# Patient Record
Sex: Female | Born: 2000 | Hispanic: Yes | Marital: Single | Smoking: Never smoker
Health system: Southern US, Community
[De-identification: ages and names within clinical notes are randomized; demographics above are authoritative.]

## PROBLEM LIST (undated history)

## (undated) DIAGNOSIS — F419 Anxiety disorder, unspecified: Secondary | ICD-10-CM

## (undated) DIAGNOSIS — F988 Other specified behavioral and emotional disorders with onset usually occurring in childhood and adolescence: Secondary | ICD-10-CM

## (undated) HISTORY — PX: NO PAST SURGERIES: SHX2092

## (undated) HISTORY — DX: Anxiety disorder, unspecified: F41.9

---
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
## 2022-01-12 ENCOUNTER — Encounter: Payer: Self-pay | Admitting: Physician Assistant

## 2022-04-14 ENCOUNTER — Encounter: Payer: Self-pay | Admitting: Physician Assistant

## 2022-05-08 IMAGING — DX DG ANKLE COMPLETE 3+V*L*
3 series · 3 of 3 positions shown · non-contrast
Comparison: None.

CLINICAL DATA: Injury of left ankle, initial encounter. Left ankle
walled and pain since [REDACTED]. Technologist notes state fell down
flight of stairs.

EXAM:
LEFT ANKLE COMPLETE - 3+ VIEW

[ankle ap]
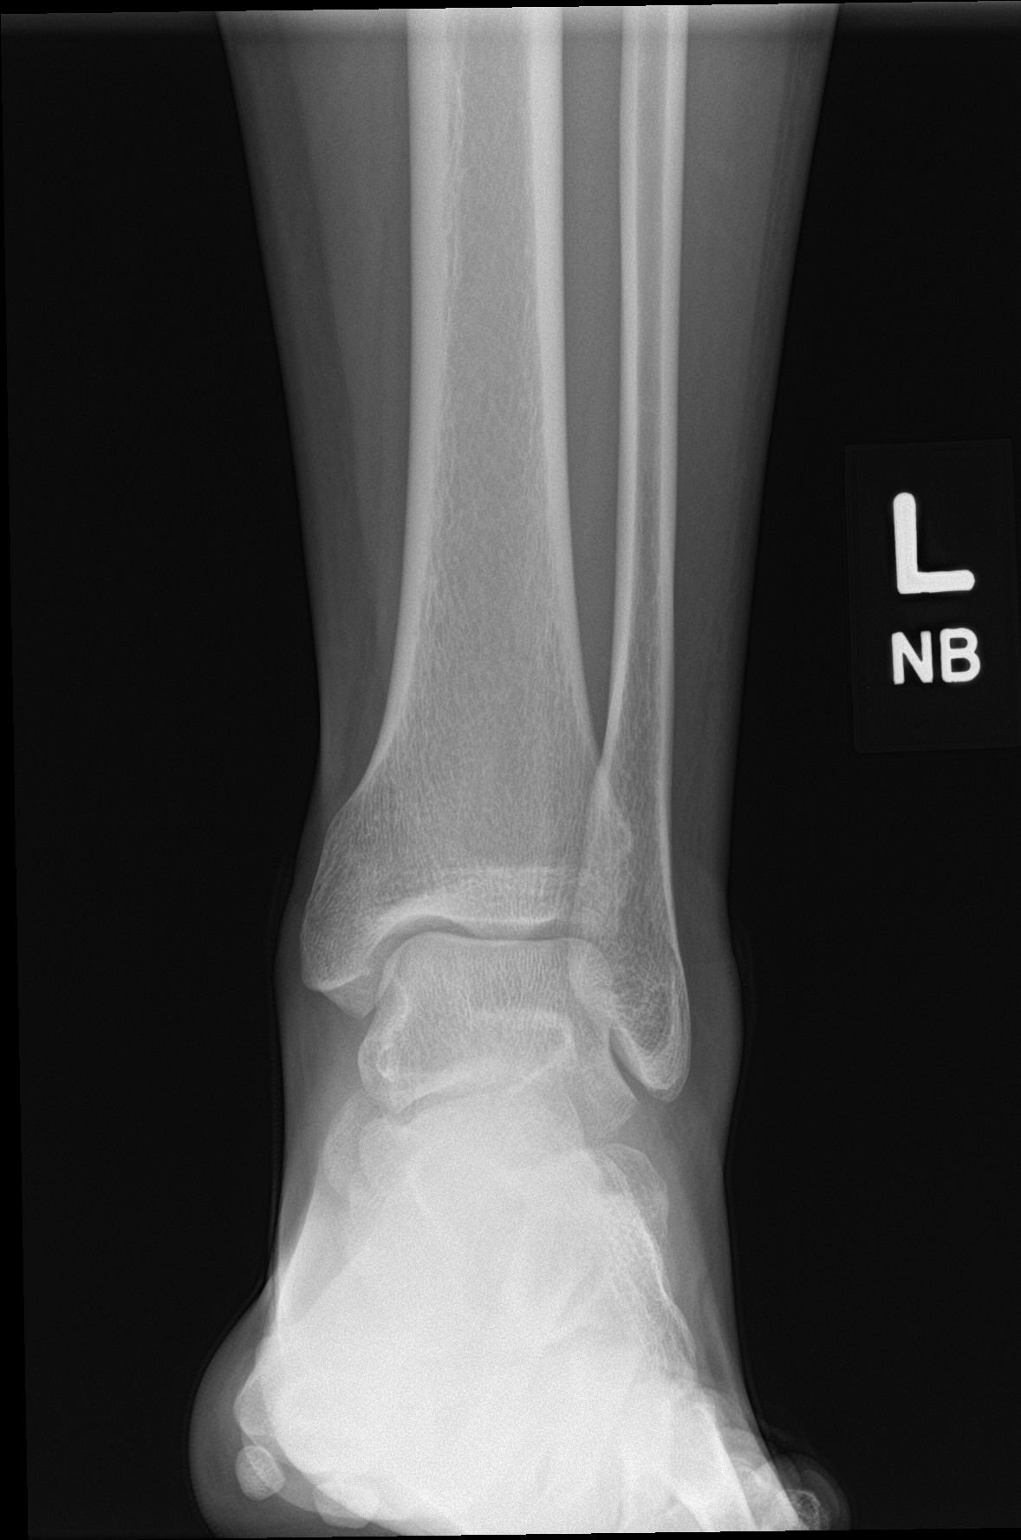

[ankle obl]
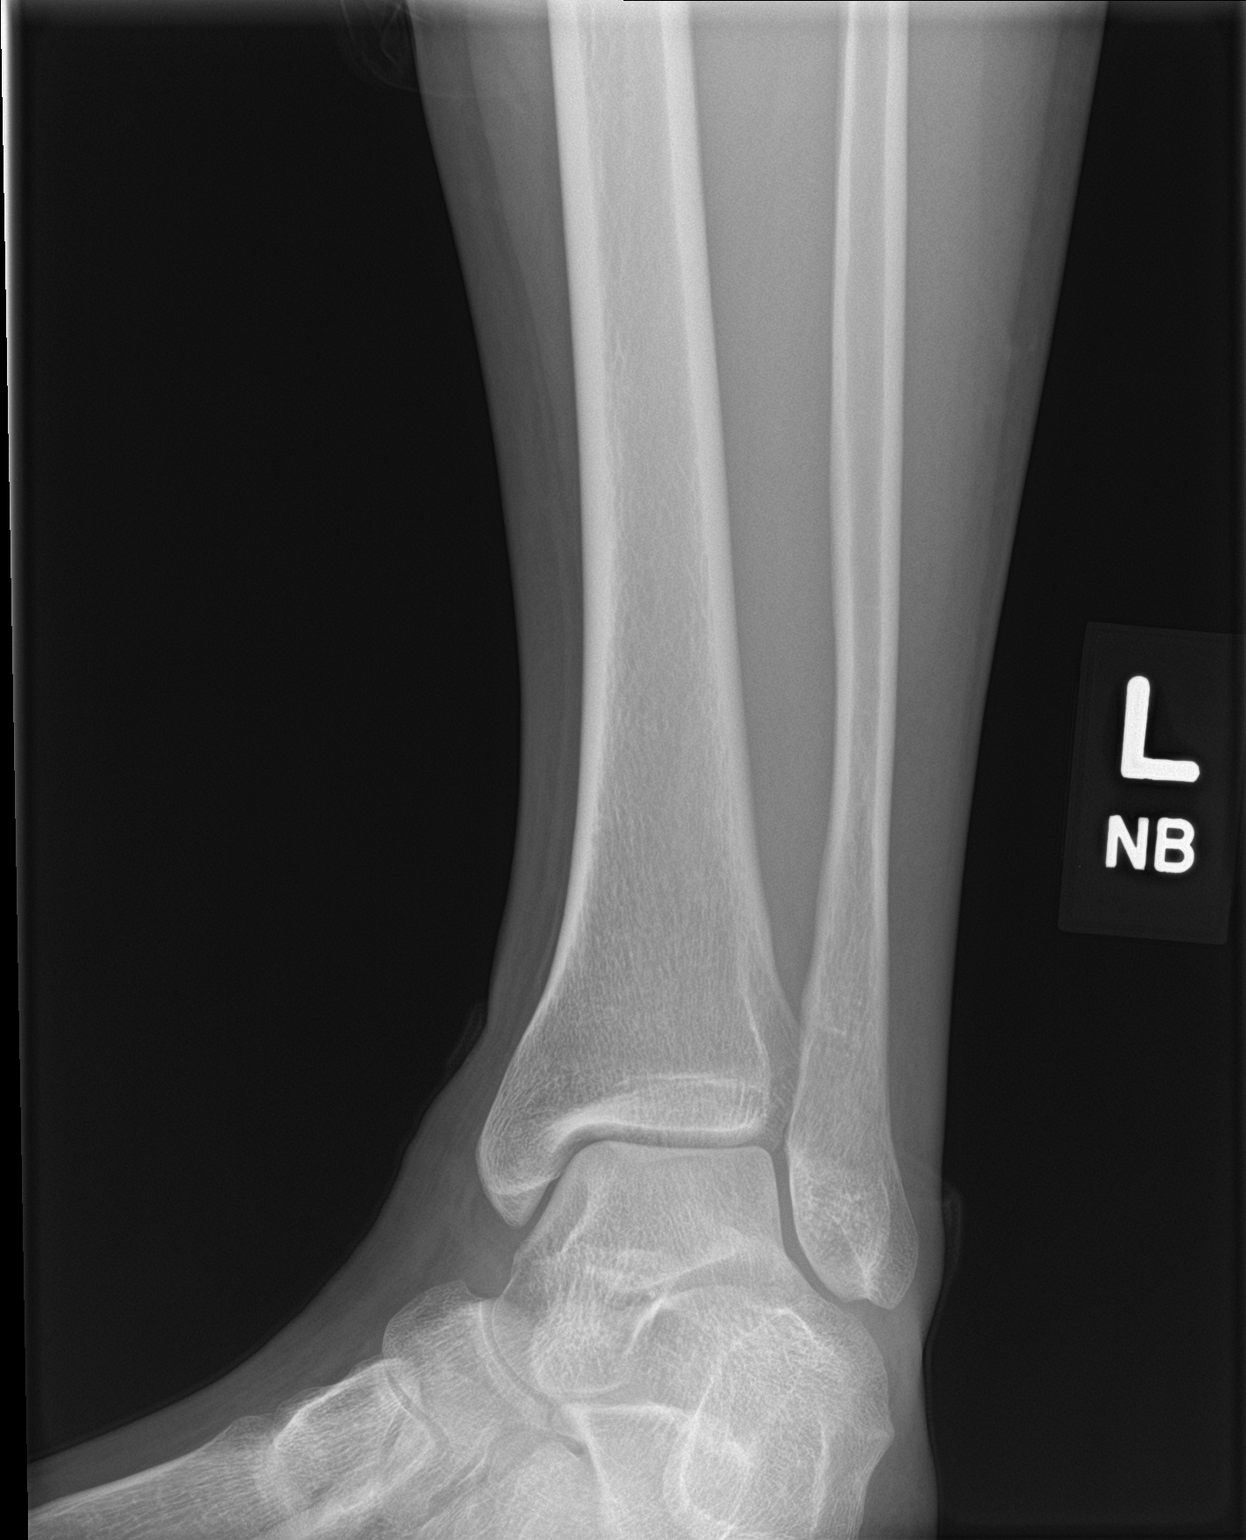

[ankle lat]
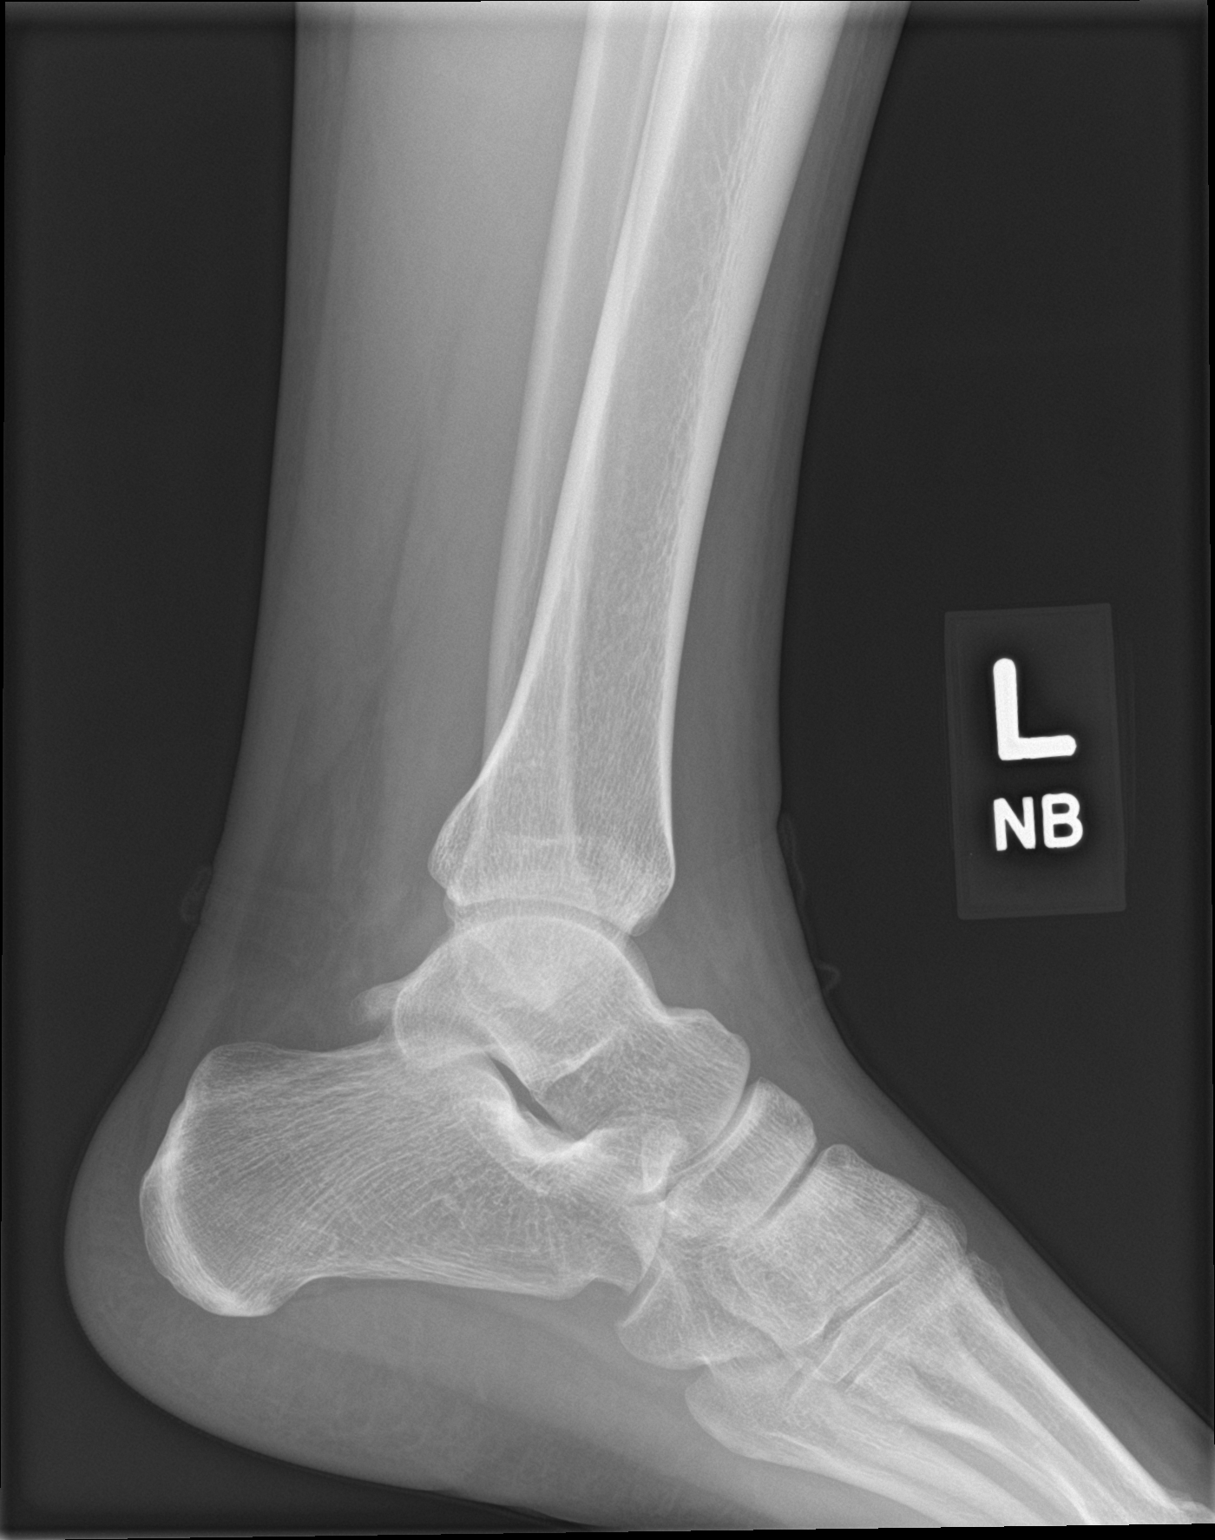

[3 of 3 positions shown; findings below may reference images not displayed]

FINDINGS: No fracture or dislocation. The ankle mortise is preserved. The base
of the fifth metatarsal is intact. Intact talar dome. Small ankle
joint effusion. There is soft tissue edema most prominent laterally.
IMPRESSION: Soft tissue edema and small joint effusion. No fracture or
dislocation.

## 2022-06-21 ENCOUNTER — Encounter (HOSPITAL_BASED_OUTPATIENT_CLINIC_OR_DEPARTMENT_OTHER): Payer: Self-pay | Admitting: Pediatrics

## 2022-06-21 ENCOUNTER — Other Ambulatory Visit: Payer: Self-pay

## 2022-06-21 ENCOUNTER — Emergency Department (HOSPITAL_BASED_OUTPATIENT_CLINIC_OR_DEPARTMENT_OTHER): Payer: Medicaid Other

## 2022-06-21 ENCOUNTER — Emergency Department (HOSPITAL_BASED_OUTPATIENT_CLINIC_OR_DEPARTMENT_OTHER)
Admission: EM | Admit: 2022-06-21 | Discharge: 2022-06-22 | Disposition: A | Discharge status: Home / Self Care | Payer: Medicaid Other | Attending: Emergency Medicine | Admitting: Emergency Medicine

## 2022-06-21 DIAGNOSIS — B9689 Other specified bacterial agents as the cause of diseases classified elsewhere: Secondary | ICD-10-CM | POA: Diagnosis not present

## 2022-06-21 DIAGNOSIS — N83291 Other ovarian cyst, right side: Secondary | ICD-10-CM | POA: Diagnosis not present

## 2022-06-21 DIAGNOSIS — N83292 Other ovarian cyst, left side: Secondary | ICD-10-CM | POA: Diagnosis not present

## 2022-06-21 DIAGNOSIS — R1031 Right lower quadrant pain: Secondary | ICD-10-CM | POA: Diagnosis present

## 2022-06-21 DIAGNOSIS — N760 Acute vaginitis: Secondary | ICD-10-CM | POA: Diagnosis not present

## 2022-06-21 DIAGNOSIS — N83201 Unspecified ovarian cyst, right side: Secondary | ICD-10-CM

## 2022-06-21 DIAGNOSIS — R102 Pelvic and perineal pain: Secondary | ICD-10-CM

## 2022-06-21 DIAGNOSIS — R1030 Lower abdominal pain, unspecified: Secondary | ICD-10-CM

## 2022-06-21 LAB — URINALYSIS, ROUTINE W REFLEX MICROSCOPIC
Bilirubin Urine: NEGATIVE
Glucose, UA: NEGATIVE mg/dL
Hgb urine dipstick: NEGATIVE
Ketones, ur: NEGATIVE mg/dL
Leukocytes,Ua: NEGATIVE
Nitrite: NEGATIVE
Protein, ur: NEGATIVE mg/dL
Specific Gravity, Urine: 1.02 (ref 1.005–1.030)
pH: 7.5 (ref 5.0–8.0)

## 2022-06-21 LAB — CBC WITH DIFFERENTIAL/PLATELET
Abs Immature Granulocytes: 0.03 10*3/uL (ref 0.00–0.07)
Basophils Absolute: 0 10*3/uL (ref 0.0–0.1)
Basophils Relative: 0 %
Eosinophils Absolute: 0.3 10*3/uL (ref 0.0–0.5)
Eosinophils Relative: 3 %
HCT: 38.4 % (ref 36.0–46.0)
Hemoglobin: 13.1 g/dL (ref 12.0–15.0)
Immature Granulocytes: 0 %
Lymphocytes Relative: 35 %
Lymphs Abs: 3.1 10*3/uL (ref 0.7–4.0)
MCH: 30.5 pg (ref 26.0–34.0)
MCHC: 34.1 g/dL (ref 30.0–36.0)
MCV: 89.3 fL (ref 80.0–100.0)
Monocytes Absolute: 0.6 10*3/uL (ref 0.1–1.0)
Monocytes Relative: 6 %
Neutro Abs: 4.9 10*3/uL (ref 1.7–7.7)
Neutrophils Relative %: 56 %
Platelets: 315 10*3/uL (ref 150–400)
RBC: 4.3 MIL/uL (ref 3.87–5.11)
RDW: 12.8 % (ref 11.5–15.5)
WBC: 8.9 10*3/uL (ref 4.0–10.5)
nRBC: 0 % (ref 0.0–0.2)

## 2022-06-21 LAB — WET PREP, GENITAL
Sperm: NONE SEEN
Trich, Wet Prep: NONE SEEN
WBC, Wet Prep HPF POC: >=10 — AB (ref <10)
Yeast Wet Prep HPF POC: NONE SEEN

## 2022-06-21 LAB — COMPREHENSIVE METABOLIC PANEL
ALT: 12 U/L (ref 0–44)
AST: 18 U/L (ref 15–41)
Albumin: 4 g/dL (ref 3.5–5.0)
Alkaline Phosphatase: 47 U/L (ref 38–126)
Anion gap: 7 (ref 5–15)
BUN: 12 mg/dL (ref 6–20)
CO2: 27 mmol/L (ref 22–32)
Calcium: 8.8 mg/dL — ABNORMAL LOW (ref 8.9–10.3)
Chloride: 104 mmol/L (ref 98–111)
Creatinine, Ser: 0.67 mg/dL (ref 0.44–1.00)
GFR, Estimated: >60 mL/min (ref >60)
Glucose, Bld: 101 mg/dL — ABNORMAL HIGH (ref 70–99)
Potassium: 3.4 mmol/L — ABNORMAL LOW (ref 3.5–5.1)
Sodium: 138 mmol/L (ref 135–145)
Total Bilirubin: 0.6 mg/dL (ref 0.3–1.2)
Total Protein: 6.9 g/dL (ref 6.5–8.1)

## 2022-06-21 LAB — PREGNANCY, URINE: Preg Test, Ur: NEGATIVE

## 2022-06-21 MED ORDER — MORPHINE SULFATE (PF) 2 MG/ML IV SOLN
2.0000 mg | Freq: Once | INTRAVENOUS | Status: AC
  Administered 2022-06-21: 2 mg via INTRAVENOUS
  Filled 2022-06-21: qty 1

## 2022-06-21 MED ORDER — HYDROCODONE-ACETAMINOPHEN 5-325 MG PO TABS: 1.0000 | ORAL_TABLET | Freq: Four times a day (QID) | ORAL | 0 refills | Status: DC | PRN

## 2022-06-21 MED ORDER — METRONIDAZOLE 500 MG PO TABS: 500.0000 mg | ORAL_TABLET | Freq: Two times a day (BID) | ORAL | 0 refills | Status: DC

## 2022-06-21 MED ORDER — IOHEXOL 300 MG/ML  SOLN
100.0000 mL | Freq: Once | INTRAMUSCULAR | Status: AC | PRN
  Administered 2022-06-21: 100 mL via INTRAVENOUS

## 2022-06-21 MED ORDER — ONDANSETRON 4 MG PO TBDP: 4.0000 mg | ORAL_TABLET | Freq: Three times a day (TID) | ORAL | 0 refills | Status: DC | PRN

## 2022-06-21 MED ORDER — ONDANSETRON HCL 4 MG/2ML IJ SOLN
4.0000 mg | Freq: Once | INTRAMUSCULAR | Status: AC
  Administered 2022-06-21: 4 mg via INTRAVENOUS
  Filled 2022-06-21: qty 2

## 2022-06-21 MED ORDER — SODIUM CHLORIDE 0.9 % IV BOLUS
1000.0000 mL | Freq: Once | INTRAVENOUS | Status: AC
  Administered 2022-06-21: 1000 mL via INTRAVENOUS

## 2022-06-21 NOTE — Discharge Instructions (Addendum)
You were evaluated in the emergency department today for abdominal/pelvic pain.  There were a few findings of note from today's encounter.  First you tested positive for bacterial vaginosis.  An antibiotic has been sent to the pharmacy by the name of Flagyl.  You are to take 1 tablet every 12 hours for the next week.  Take until the course has been completed.  Do not drink alcohol within 72 hours before starting this medication or finishing this medication, due to significant side effect of severe projectile nausea and vomiting.  Second, CT scan indicates a large right ovarian cyst measuring 5 cm and a moderate left ovarian cyst measuring 2.5 cm.  You have been provided contact information for local OB/GYN office.  Please call first thing tomorrow morning to schedule a follow-up appointment to establish care and for reevaluation within the next 2-3 days.  Also, please refrain from activities that are known to increase the lower abdominal pain/pelvic pain, such as sexual intercourse, until you are able to follow-up with OB/GYN.  Third, CT imaging also indicated an incidental finding of a cyst on the pancreatic tail measuring 1.2 cm.  An MRI with and without contrast is recommended on an outpatient basis for further evaluation.  Follow-up with your PCP to have this scheduled.  As a reminder, your tests for gonorrhea and chlamydia take about 2 days to result.  If you do not receive a phone call, then proceed with daily activities and this needs no further intervention.  If you receive a phone call, then likely 1 of these test is positive and you will receive further instructions with likely antibiotic therapy.  Continue to manage pain at home with a prescription I have sent to your pharmacy.  This is called vicodin and is a narcotic.  Take one tablet every 6 hours as needed.  You may take ibuprofen with this as needed.  Always take with plenty of food and water.  I have also sent a prescription for Zofran to  the pharmacy, this is an antinausea medication.  Sometimes the Vicodin may have a side effect of nausea, so the Zofran can help with this.  Return to the ED for new or worsening symptoms as discussed.

## 2022-06-21 NOTE — ED Triage Notes (Signed)
C/O lower abdominal pain started started last night; along w/ pressure; stated IUD placed 2 years ago;

## 2022-06-21 NOTE — ED Provider Notes (Incomplete)
[facility] EMERGENCY DEPARTMENT Provider Note   CSN: [id number] Arrival date & time: 06/21/22  1657     History {Add pertinent medical, surgical, social history, OB history to HPI:1} Chief Complaint  Patient presents with  . Abdominal Pain    Sherri Mack is a 21 y.o. female with Hx of anxiety and bipolar disorder presenting today with lower abdominal pain.  States 1.5 weeks ago started having some lower abdominal and right lower quadrant pain radiating to the right lower part of her back.  States this has been intermittent, unable to specify anything specific preceding the pain or symptoms.  Reports she has been sexually active, and has noticed increased pain with intercourse.  Denies vaginal discharge, vaginal bleeding, or vaginal pain.  No recent sexual partners.  Most recent intercourse 3 to 4 days ago.  Over the last 1 to 2 days, has noted significant lower abdominal tenderness that had her in tears.  States the pain is mild today, concerned her IUD may be misplaced.  Had this placed 2 years ago, never followed up for reevaluation to confirm placement.  Has not seen OB/GYN since.  Denies N/V/D, fevers, chills, midline back pain, IVDU, recent injury or trauma, changes in bowel habits, dysuria, or urinary retention.  Though does state when she tries to urinate, feels a "pressure" on her bladder from the lower abdominal pain.  The history is provided by the patient and medical records.  Abdominal Pain     Home Medications Prior to Admission medications   Not on File      Allergies    Patient has no known allergies.    Review of Systems   Review of Systems  Gastrointestinal:  Positive for abdominal pain.    Physical Exam Updated Vital Signs BP 108/62 (BP Location: Right Arm)   Pulse 74   Temp 98.5 F (36.9 C) (Oral)   Resp 14   Ht 5\' 2"  (1.575 m)   Wt 61.2 kg   LMP 05/30/2022   SpO2 99%   BMI 24.69 kg/m  Physical Exam Vitals and nursing note reviewed.  Exam conducted with a chaperone present.  Constitutional:      General: She is not in acute distress.    Appearance: She is well-developed. She is not ill-appearing, toxic-appearing or diaphoretic.  HENT:     Head: Normocephalic and atraumatic.  Eyes:     Conjunctiva/sclera: Conjunctivae normal.  Cardiovascular:     Rate and Rhythm: Normal rate and regular rhythm.     Heart sounds: No murmur heard. Pulmonary:     Effort: Pulmonary effort is normal. No respiratory distress.     Breath sounds: Normal breath sounds.  Abdominal:     General: Abdomen is flat. There is no distension.     Palpations: Abdomen is soft. There is no mass.     Tenderness: There is abdominal tenderness in the right lower quadrant and suprapubic area. There is rebound (Mild). There is no guarding. Positive signs include McBurney's sign. Negative signs include Murphy's sign.  Genitourinary:    Exam position: Lithotomy position.     Pubic Area: No rash.      Labia:        Right: No rash or tenderness.        Left: No rash or tenderness.      Vagina: Vaginal discharge (White) present.     Cervix: No cervical motion tenderness, discharge or friability.     Uterus: Tender. Not enlarged.  Adnexa: Left adnexa normal.       Right: Tenderness present.   Musculoskeletal:        General: No swelling.     Cervical back: Neck supple.  Skin:    General: Skin is warm and dry.     Capillary Refill: Capillary refill takes less than 2 seconds.     Coloration: Skin is not cyanotic, jaundiced or pale.  Neurological:     Mental Status: She is alert.  Psychiatric:        Mood and Affect: Mood normal.     ED Results / Procedures / Treatments   Labs (all labs ordered are listed, but only abnormal results are displayed) Labs Reviewed  WET PREP, GENITAL  URINALYSIS, ROUTINE W REFLEX MICROSCOPIC  PREGNANCY, URINE  CBC WITH DIFFERENTIAL/PLATELET  COMPREHENSIVE METABOLIC PANEL  GC/CHLAMYDIA PROBE AMP ([place]) NOT  AT [facility]    EKG None  Radiology No results found.  Procedures Procedures  {Document cardiac monitor, telemetry assessment procedure when appropriate:1}  Medications Ordered in ED Medications  sodium chloride 0.9 % bolus 1,000 mL (has no administration in time range)  ondansetron (ZOFRAN) injection 4 mg (has no administration in time range)  morphine (PF) 2 MG/ML injection 2 mg (has no administration in time range)    ED Course/ Medical Decision Making/ A&P Clinical Course as of 06/21/22 2212  Wed Jun 21, 2022  2141 Clue Cells Wet Prep HPF POC(!): PRESENT [AC]    Clinical Course User Index [AC] Cecil Cobbs, PA-C                           Medical Decision Making Amount and/or Complexity of Data Reviewed Labs: ordered.   21 y.o. female presents to the ED for concern of Abdominal Pain   This involves an extensive number of treatment options, and is a complaint that carries with it a high risk of complications and morbidity.  The emergent differential diagnosis prior to evaluation includes, but is not limited to: ***  This is not an exhaustive differential.   Past Medical History / Co-morbidities / Social History: Hx of anxiety and bipolar disorder.  IUD placed 2 years ago. Social Determinants of Health include: No OBGYN, resources provided  Additional History:  Obtained by chart review.  Notably ***  Lab Tests: I ordered, and personally interpreted labs.  The pertinent results include:   Urine pregnancy: negative UA unremarkable  Imaging Studies: I ordered imaging studies including CT abdomen pelvis.   I independently visualized and interpreted imaging which showed *** I agree with the radiologist interpretation.  ED Course: Pt well-appearing on exam.  ***.    Patient in NAD and in good condition at time of discharge.  Disposition: After consideration the patient's encounter today, I do not feel today's workup suggests an emergent condition requiring  admission or immediate intervention beyond what has been performed at this time.  Safe for discharge; instructed to return immediately for worsening symptoms, change in symptoms or any other concerns.  I have reviewed the patients home medicines and have made adjustments as needed.  Discussed course of treatment with the patient, whom demonstrated understanding.  Patient in agreement and has no further questions.    I discussed this case with my attending physician Dr. Silverio Lay, who agreed with the proposed treatment course and cosigned this note including patient's presenting symptoms, physical exam, and planned diagnostics and interventions.  Attending physician stated agreement with plan  or made changes to plan which were implemented.     This chart was dictated using voice recognition software.  Despite best efforts to proofread, errors can occur which can change the documentation meaning.   {Document critical care time when appropriate:1} {Document review of labs and clinical decision tools ie heart score, Chads2Vasc2 etc:1}  {Document your independent review of radiology images, and any outside records:1} {Document your discussion with family members, caretakers, and with consultants:1} {Document social determinants of health affecting pt's care:1} {Document your decision making why or why not admission, treatments were needed:1} Final Clinical Impression(s) / ED Diagnoses Final diagnoses:  None    Rx / DC Orders ED Discharge Orders     None

## 2022-06-22 LAB — GC/CHLAMYDIA PROBE AMP (~~LOC~~) NOT AT ARMC
Chlamydia: NEGATIVE
Comment: NEGATIVE
Comment: NORMAL
Neisseria Gonorrhea: NEGATIVE

## 2022-06-23 ENCOUNTER — Telehealth: Payer: Self-pay | Admitting: General Practice

## 2022-06-23 NOTE — Telephone Encounter (Signed)
Transition Care Management Unsuccessful Follow-up Telephone Call  Date of discharge and from where:  06/22/22 from [facility]  Attempts:  1st Attempt  Reason for unsuccessful TCM follow-up call:  No answer/busy

## 2022-06-26 ENCOUNTER — Ambulatory Visit: Payer: Medicaid Other | Admitting: Physician Assistant

## 2022-06-26 NOTE — Telephone Encounter (Signed)
Missed appt this morning due to being called in to work.

## 2022-06-26 NOTE — Telephone Encounter (Signed)
Transition Care Management Unsuccessful Follow-up Telephone Call  Date of discharge and from where:  06/22/22 from [facility]  Attempts:  2nd Attempt  Reason for unsuccessful TCM follow-up call:  No answer/busy

## 2022-06-28 ENCOUNTER — Encounter: Payer: Self-pay | Admitting: Physician Assistant

## 2022-06-28 ENCOUNTER — Ambulatory Visit (INDEPENDENT_AMBULATORY_CARE_PROVIDER_SITE_OTHER): Payer: Medicaid Other | Admitting: Physician Assistant

## 2022-06-28 VITALS — BP 102/62 | HR 98 | Ht 62.0 in | Wt 135.0 lb

## 2022-06-28 DIAGNOSIS — K862 Cyst of pancreas: Secondary | ICD-10-CM | POA: Insufficient documentation

## 2022-06-28 DIAGNOSIS — N83201 Unspecified ovarian cyst, right side: Secondary | ICD-10-CM | POA: Insufficient documentation

## 2022-06-28 DIAGNOSIS — N83202 Unspecified ovarian cyst, left side: Secondary | ICD-10-CM | POA: Diagnosis not present

## 2022-06-28 DIAGNOSIS — R1030 Lower abdominal pain, unspecified: Secondary | ICD-10-CM | POA: Diagnosis not present

## 2022-06-28 MED ORDER — HYDROCODONE-ACETAMINOPHEN 5-325 MG PO TABS: 1.0000 | ORAL_TABLET | Freq: Three times a day (TID) | ORAL | 0 refills | Status: DC | PRN

## 2022-06-28 MED ORDER — HYDROCODONE-ACETAMINOPHEN 5-325 MG PO TABS: 1.0000 | ORAL_TABLET | Freq: Three times a day (TID) | ORAL | 0 refills | Status: AC | PRN

## 2022-06-28 NOTE — Telephone Encounter (Signed)
Transition Care Management Unsuccessful Follow-up Telephone Call  Date of discharge and from where:  06/22/22 from [facility]  Attempts:  3rd Attempt  Reason for unsuccessful TCM follow-up call:  No answer/busy Patient has an appt this afternoon (06/28/22 at 3 pm with PCP).

## 2022-06-28 NOTE — Addendum Note (Signed)
Addended by: Jomarie Longs on: 06/28/2022 04:36 PM   Modules accepted: Orders

## 2022-06-28 NOTE — Patient Instructions (Addendum)
Will will order the MRI of abdomen  Ovarian Cyst  An ovarian cyst is a fluid-filled sac that forms on an ovary. The ovaries are small organs that produce eggs in women. Various types of cysts can form on the ovaries. Some may cause symptoms and require treatment. Most ovarian cysts go away on their own, are not cancerous (are benign), and do not cause problems. What are the causes? Ovarian cysts may be caused by: Ovarian hyperstimulation syndrome. This is a condition that can develop from taking fertility medicines. It causes multiple large ovarian cysts to form. Polycystic ovarian syndrome (PCOS). This is a common hormonal disorder that can cause ovarian cysts to form, and can cause problems with your period or fertility. The normal menstrual cycle. What increases the risk? The following factors may make you more likely to develop this condition: Being overweight or obese. Taking fertility medicines. Taking certain forms of hormonal birth control. Smoking. What are the signs or symptoms? Many ovarian cysts do not cause symptoms. If symptoms are present, they may include: Pelvic pain or pressure. Pain in the lower abdomen. Pain during sex. Abdominal swelling. Abnormal menstrual periods. Increasing pain with menstrual periods. How is this diagnosed? These cysts are commonly found during a routine pelvic exam. You may have tests to find out more about the cyst, such as: Ultrasound. CT scan. MRI. Blood tests. How is this treated? Many ovarian cysts go away on their own without treatment. Your health care provider may want to check your cyst regularly for 2-3 months to see if it changes. If you are in menopause, it is especially important to have your cyst monitored closely because menopausal women have a higher rate of ovarian cancer. When treatment is needed, it may include: Medicines to help relieve pain. A procedure to drain the cyst (aspiration). Surgery to remove the whole cyst  (cystectomy). Hormone treatment or birth control pills. These methods are sometimes used to help keep cysts from coming back. Surgery to remove the ovary (oophorectomy). Follow these instructions at home: Take over-the-counter and prescription medicines only as told by your health care provider. Ask your health care provider if any medicine prescribed to you requires you to avoid driving or using machinery. Get regular pelvic exams and Pap tests as often as told by your health care provider. Return to your normal activities as told by your health care provider. Ask your health care provider what activities are safe for you. Do not use any products that contain nicotine or tobacco, such as cigarettes, e-cigarettes, and chewing tobacco. If you need help quitting, ask your health care provider. Keep all follow-up visits. This is important. Contact a health care provider if: Your periods are late, irregular, painful, or they stop. You have pelvic pain that does not go away. You have pressure on your bladder or trouble emptying your bladder completely. You have any of the following: A feeling of fullness. You are gaining weight or losing weight without changing your exercise and eating habits. Pain, swelling, or bloating in the abdomen. Loss of appetite. Pain and pressure in your back and pelvis. You think you may be pregnant. Get help right away if: You have abdominal or pelvic pain that is severe or gets worse. You cannot eat or drink without vomiting. You suddenly develop a fever or chills. Your menstrual period is much heavier than usual. Summary An ovarian cyst is a fluid-filled sac that forms on an ovary. Some ovarian cysts may cause symptoms and require treatment. These  cysts are commonly found during a routine pelvic exam. Many ovarian cysts go away on their own without treatment. This information is not intended to replace advice given to you by your health care provider. Make sure  you discuss any questions you have with your health care provider. Document Revised: 01/08/2020 Document Reviewed: 01/08/2020 Elsevier Patient Education  2023 [employer].

## 2022-06-28 NOTE — Progress Notes (Signed)
Established Patient Office Visit  Subjective   Patient ID: Sherri Mack, female    DOB: 08/15/2000  Age: 21 y.o. MRN: [id number]  Chief Complaint  Patient presents with   Hospitalization Follow-up    HPI Pt is a 21 yo female with lower abdominal pain who presents to the clinic to follow up after hospital visit on 06/21/2022. She had had lower abdominal pain for the last 1.5 weeks when went to ED and found her to have BV, bilateral ovarian cyst, pancreatic cyst. She was given metronidazole, zofran and norco. Her pain continues but not quite as bad as when she went to ED.   Patient Active Problem List   Diagnosis Date Noted   Cysts of both ovaries 06/28/2022   Lower abdominal pain 06/28/2022   Pancreatic cyst 06/28/2022   Inattention 12/27/2021   Impulsiveness 12/27/2021   Outbursts of anger 12/26/2021   Poor concentration 12/26/2021   Injury of left ankle 06/28/2021   Anxiety 06/14/2021   Bipolar 1 disorder (HCC) 06/14/2021   Past Medical History:  Diagnosis Date   Anxiety    Family History  Problem Relation Age of Onset   Ovarian cancer Maternal Grandmother    Ovarian cancer Maternal Aunt    No Known Allergies   ROS See HPI.    Objective:     BP 102/62   Pulse 98   Ht 5\' 2"  (1.575 m)   Wt 135 lb (61.2 kg)   LMP 05/30/2022   SpO2 100%   BMI 24.69 kg/m  BP Readings from Last 3 Encounters:  06/28/22 102/62  06/21/22 110/63  12/26/21 124/65   Wt Readings from Last 3 Encounters:  06/28/22 135 lb (61.2 kg) (64 %, Z= 0.36)*  06/21/22 135 lb (61.2 kg) (64 %, Z= 0.37)*  12/26/21 131 lb (59.4 kg) (60 %, Z= 0.25)*   * Growth percentiles are based on CDC (Girls, 2-20 Years) data.      Physical Exam Constitutional:      Appearance: Normal appearance.  Cardiovascular:     Rate and Rhythm: Normal rate.  Pulmonary:     Effort: Pulmonary effort is normal.  Abdominal:     General: There is no distension.     Tenderness: There is abdominal tenderness. There  is no right CVA tenderness, left CVA tenderness or guarding.  Neurological:     General: No focal deficit present.     Mental Status: She is alert.  Psychiatric:        Mood and Affect: Mood normal.        Assessment & Plan:  05/17/23Marland KitchenLilyann was seen today for hospitalization follow-up.  Diagnoses and all orders for this visit:  Cysts of both ovaries -     HYDROcodone-acetaminophen (NORCO/VICODIN) 5-325 MG tablet; Take 1-2 tablets by mouth every 8 (eight) hours as needed for up to 5 days for moderate pain. -     Ambulatory referral to Obstetrics / Gynecology -     MR Abdomen W Wo Contrast; Future  Lower abdominal pain -     HYDROcodone-acetaminophen (NORCO/VICODIN) 5-325 MG tablet; Take 1-2 tablets by mouth every 8 (eight) hours as needed for up to 5 days for moderate pain. -     Ambulatory referral to Obstetrics / Gynecology -     MR Abdomen W Wo Contrast; Future  Pancreatic cyst -     MR Abdomen W Wo Contrast; Future   Urgent referral made to see GYN due to bilateral ovarian cyst HO given  Refilled norco to use as needed with ibuprofen 800mg  up to three times a day MR of abdomen ordered to evaluate pancreatic cyst Reviewed PMDP No concerns Follow up as needed or if symptoms persist or worsen   , PA-C

## 2022-06-30 NOTE — Progress Notes (Unsigned)
GYNECOLOGY OFFICE VISIT NOTE  History:   Sherri Mack is a 21 y.o. G0 here today for follow up for ovarian cysts. She went to the ED on 11/8. She saw her PCP on 11/15 and her pain had improved.   Her imaging was done by CT. She showed 5 cm cystic structure posterior to the uterus and 2.5 cm left cystic structure. She did not have an [place].  Her CBC was normal.   She does have a family history of ovarian cancer - her MFM and m. Aunt.     Past Medical History:  Diagnosis Date   Anxiety     Past Surgical History:  Procedure Laterality Date   NO PAST SURGERIES      The following portions of the patient's history were reviewed and updated as appropriate: allergies, current medications, past family history, past medical history, past social history, past surgical history and problem list.   Review of Systems:  Pertinent items noted in HPI and remainder of comprehensive ROS otherwise negative.  Physical Exam:  LMP 05/30/2022  CONSTITUTIONAL: Well-developed, well-nourished female in no acute distress.  HEENT:  Normocephalic, atraumatic. External right and left ear normal. No scleral icterus.  NECK: Normal range of motion, supple, no masses noted on observation SKIN: No rash noted. Not diaphoretic. No erythema. No pallor. MUSCULOSKELETAL: Normal range of motion. No edema noted. NEUROLOGIC: Alert and oriented to person, place, and time. Normal muscle tone coordination. No cranial nerve deficit noted. PSYCHIATRIC: Normal mood and affect. Normal behavior. Normal judgment and thought content.  PELVIC: Deferred  Labs and Imaging No results found for this or any previous visit (from the past 168 hour(s)). CT Abdomen Pelvis W Contrast  Result Date: 06/21/2022 CLINICAL DATA:  Flank pain, kidney stone suspected. Suprapubic and right lower quadrant abdominal pain, McBurney point tenderness and rebound tenderness. EXAM: CT ABDOMEN AND PELVIS WITH CONTRAST TECHNIQUE: Multidetector CT imaging  of the abdomen and pelvis was performed using the standard protocol following bolus administration of intravenous contrast. RADIATION DOSE REDUCTION: This exam was performed according to the departmental dose-optimization program which includes automated exposure control, adjustment of the mA and/or kV according to patient size and/or use of iterative reconstruction technique. CONTRAST:  OMNIPAQUE IOHEXOL 300 MG/ML  SOLN COMPARISON:  None Available. FINDINGS: Lower chest: No acute abnormality. Hepatobiliary: No focal liver abnormality is seen. No gallstones, gallbladder wall thickening, or biliary dilatation. Pancreas: No pancreatic ductal dilatation or surrounding inflammatory changes. An indeterminate hypodensity is present in the tail of the pancreas measuring 1.2 cm. Spleen: Normal in size without focal abnormality. Adrenals/Urinary Tract: No adrenal nodule or mass. The kidneys enhance symmetrically. A punctate calcification is noted in the lower pole the right kidney. No obstructive uropathy bilaterally. The bladder is unremarkable. Stomach/Bowel: Stomach is within normal limits. The visualized portion of the appendix is normal in caliber and contains air, best seen on axial image 52. No evidence of bowel wall thickening, distention, or inflammatory changes. No free air or pneumatosis. Vascular/Lymphatic: No significant vascular findings are present. No enlarged abdominal or pelvic lymph nodes. Reproductive: An IUD is present in the uterus. Cystic structure measuring 5.0 cm is noted posterior to the uterus, possible right adnexa. There is a 2.5 cm cystic structure in the left adnexa. Other: No abdominal wall hernia or abnormality. No abdominopelvic ascites. Musculoskeletal: No acute osseous abnormality. IMPRESSION: 1. No acute intra-abdominal process.  Normal appendix. 2. Punctate nonobstructive right renal calculus. 3. Large cystic structure in the right adnexa  measuring 5 cm. A cystic structure is  present in the left adnexa measuring 2.5 cm. No follow-up imaging is recommended. 4. Cystic structure in the pancreatic tail measuring 1.2 cm. MRI with and without contrast is suggested for further characterization on follow-up. Electronically Signed   By: Thornell Sartorius M.D.   On: 06/21/2022 22:40    Assessment and Plan:   1. Cysts of both ovaries - Recommend pelvic US to characterize anticipated resolution.  - IUD in place by CT - unlikely source of pain.     Diagnoses and all orders for this visit:  Cysts of both ovaries    Routine preventative health maintenance measures emphasized. Please refer to After Visit Summary for other counseling recommendations.   No follow-ups on file.  Milas Hock, MD, FACOG Obstetrician & Gynecologist, [facility] for [facility], [facility]

## 2022-07-03 ENCOUNTER — Encounter: Payer: Self-pay | Admitting: Obstetrics and Gynecology

## 2022-07-03 ENCOUNTER — Other Ambulatory Visit: Payer: Self-pay | Admitting: Physician Assistant

## 2022-07-03 ENCOUNTER — Ambulatory Visit (INDEPENDENT_AMBULATORY_CARE_PROVIDER_SITE_OTHER): Payer: Medicaid Other | Admitting: Obstetrics and Gynecology

## 2022-07-03 VITALS — BP 113/70 | HR 77 | Resp 16 | Ht 62.0 in | Wt 134.0 lb

## 2022-07-03 DIAGNOSIS — N83202 Unspecified ovarian cyst, left side: Secondary | ICD-10-CM

## 2022-07-03 DIAGNOSIS — N921 Excessive and frequent menstruation with irregular cycle: Secondary | ICD-10-CM

## 2022-07-03 DIAGNOSIS — R102 Pelvic and perineal pain: Secondary | ICD-10-CM | POA: Diagnosis not present

## 2022-07-03 DIAGNOSIS — R1030 Lower abdominal pain, unspecified: Secondary | ICD-10-CM

## 2022-07-03 DIAGNOSIS — N83201 Unspecified ovarian cyst, right side: Secondary | ICD-10-CM

## 2022-07-03 DIAGNOSIS — Z975 Presence of (intrauterine) contraceptive device: Secondary | ICD-10-CM

## 2022-07-03 DIAGNOSIS — K862 Cyst of pancreas: Secondary | ICD-10-CM

## 2022-07-03 MED ORDER — TRANEXAMIC ACID 650 MG PO TABS: 1300.0000 mg | ORAL_TABLET | Freq: Three times a day (TID) | ORAL | 2 refills | Status: DC

## 2022-07-03 MED ORDER — CYCLOBENZAPRINE HCL 5 MG PO TABS: 5.0000 mg | ORAL_TABLET | Freq: Three times a day (TID) | ORAL | 1 refills | Status: DC | PRN

## 2022-07-04 ENCOUNTER — Other Ambulatory Visit: Payer: Medicaid Other

## 2022-07-10 ENCOUNTER — Other Ambulatory Visit: Payer: Medicaid Other

## 2022-07-10 ENCOUNTER — Encounter: Payer: Self-pay | Admitting: Physician Assistant

## 2022-07-11 MED ORDER — TRAMADOL HCL 50 MG PO TABS: 50.0000 mg | ORAL_TABLET | Freq: Two times a day (BID) | ORAL | 0 refills | Status: DC | PRN

## 2022-07-17 ENCOUNTER — Other Ambulatory Visit: Payer: Medicaid Other

## 2022-07-24 ENCOUNTER — Other Ambulatory Visit: Payer: Medicaid Other

## 2022-07-26 ENCOUNTER — Ambulatory Visit (HOSPITAL_BASED_OUTPATIENT_CLINIC_OR_DEPARTMENT_OTHER)
Admission: RE | Admit: 2022-07-26 | Discharge: 2022-07-26 | Disposition: A | Discharge status: Home / Self Care | Payer: Medicaid Other | Source: Ambulatory Visit | Attending: Physician Assistant | Admitting: Physician Assistant

## 2022-07-26 DIAGNOSIS — N83201 Unspecified ovarian cyst, right side: Secondary | ICD-10-CM | POA: Insufficient documentation

## 2022-07-26 DIAGNOSIS — K862 Cyst of pancreas: Secondary | ICD-10-CM | POA: Diagnosis present

## 2022-07-26 DIAGNOSIS — N83202 Unspecified ovarian cyst, left side: Secondary | ICD-10-CM | POA: Diagnosis present

## 2022-07-26 DIAGNOSIS — R1030 Lower abdominal pain, unspecified: Secondary | ICD-10-CM | POA: Diagnosis present

## 2022-07-26 MED ORDER — GADOBUTROL 1 MMOL/ML IV SOLN
6.0000 mL | Freq: Once | INTRAVENOUS | Status: AC | PRN
  Administered 2022-07-26: 6 mL via INTRAVENOUS

## 2022-07-28 NOTE — Progress Notes (Signed)
1.4cm cyst on pancreas with no worrisome features. Will keep an eye on it with MRI is 1 year.

## 2022-07-29 ENCOUNTER — Ambulatory Visit (HOSPITAL_BASED_OUTPATIENT_CLINIC_OR_DEPARTMENT_OTHER)
Admission: RE | Admit: 2022-07-29 | Discharge: 2022-07-29 | Disposition: A | Discharge status: Home / Self Care | Payer: Medicaid Other | Source: Ambulatory Visit | Attending: Physician Assistant | Admitting: Physician Assistant

## 2022-07-29 DIAGNOSIS — N83201 Unspecified ovarian cyst, right side: Secondary | ICD-10-CM | POA: Insufficient documentation

## 2022-07-29 DIAGNOSIS — N83202 Unspecified ovarian cyst, left side: Secondary | ICD-10-CM | POA: Insufficient documentation

## 2022-07-29 DIAGNOSIS — R1030 Lower abdominal pain, unspecified: Secondary | ICD-10-CM | POA: Insufficient documentation

## 2022-07-29 MED ORDER — GADOBUTROL 1 MMOL/ML IV SOLN
6.0000 mL | Freq: Once | INTRAVENOUS | Status: AC | PRN
  Administered 2022-07-29: 6 mL via INTRAVENOUS

## 2022-07-31 ENCOUNTER — Other Ambulatory Visit: Payer: Medicaid Other

## 2022-07-31 NOTE — Progress Notes (Signed)
Hani,   IUD in position.  Right ovary simple follicular cyst 4cm initially decreased to 3cm x2.5cm.Great news.  Left ovary normal with no cyst present.   I forwarded to GYN to view and comment as well about follow up.

## 2022-08-01 ENCOUNTER — Encounter: Payer: Self-pay | Admitting: Obstetrics and Gynecology

## 2022-08-21 ENCOUNTER — Ambulatory Visit: Payer: Medicaid Other | Admitting: Physician Assistant

## 2022-08-28 ENCOUNTER — Ambulatory Visit: Payer: Medicaid Other | Admitting: Physician Assistant

## 2023-01-24 ENCOUNTER — Telehealth: Payer: Self-pay

## 2023-01-24 NOTE — Telephone Encounter (Signed)
LVM for patient to call back [phone], or to call PCP office to schedule follow up apt. AS, CMA  

## 2023-04-02 ENCOUNTER — Encounter: Payer: Self-pay | Admitting: Obstetrics and Gynecology

## 2023-04-02 ENCOUNTER — Other Ambulatory Visit (HOSPITAL_COMMUNITY)
Admission: RE | Admit: 2023-04-02 | Discharge: 2023-04-02 | Disposition: A | Discharge status: Home / Self Care | Payer: Medicaid Other | Source: Ambulatory Visit | Attending: Obstetrics and Gynecology | Admitting: Obstetrics and Gynecology

## 2023-04-02 ENCOUNTER — Ambulatory Visit (INDEPENDENT_AMBULATORY_CARE_PROVIDER_SITE_OTHER): Payer: Medicaid Other | Admitting: Obstetrics and Gynecology

## 2023-04-02 VITALS — BP 122/79 | HR 79 | Temp 101.0°F | Ht 62.0 in | Wt 143.0 lb

## 2023-04-02 DIAGNOSIS — N760 Acute vaginitis: Secondary | ICD-10-CM

## 2023-04-02 DIAGNOSIS — Z3009 Encounter for other general counseling and advice on contraception: Secondary | ICD-10-CM

## 2023-04-02 DIAGNOSIS — N898 Other specified noninflammatory disorders of vagina: Secondary | ICD-10-CM | POA: Insufficient documentation

## 2023-04-02 DIAGNOSIS — Z113 Encounter for screening for infections with a predominantly sexual mode of transmission: Secondary | ICD-10-CM

## 2023-04-02 DIAGNOSIS — B9689 Other specified bacterial agents as the cause of diseases classified elsewhere: Secondary | ICD-10-CM

## 2023-04-02 MED ORDER — METRONIDAZOLE 500 MG PO TABS: 500.0000 mg | ORAL_TABLET | Freq: Two times a day (BID) | ORAL | 0 refills | Status: AC

## 2023-04-02 NOTE — Progress Notes (Signed)
Pt requests [facility] STI testing but declines blood work

## 2023-04-02 NOTE — Progress Notes (Signed)
GYNECOLOGY OFFICE NOTE  History:  22 y.o. G0P0000 here today for abnormal discharge, has been happening for at least two months. Normal discharge is clearish white but now occasionally has yellowish color. Also has malodor and using Summer's Eve wipes. Had STI testing at [facility] in June 2024 which was all negative.  Also had two ovarian cysts in 06/2022.  Has had IUD since age 62. Has never had trouble with IUD before recently.    Past Medical History:  Diagnosis Date   Anxiety     Past Surgical History:  Procedure Laterality Date   NO PAST SURGERIES       Current Outpatient Medications:    levonorgestrel (KYLEENA) 19.5 MG IUD, 1 each by Intrauterine route once., Disp: , Rfl:    metroNIDAZOLE (FLAGYL) 500 MG tablet, Take 1 tablet (500 mg total) by mouth 2 (two) times daily for 7 days., Disp: 14 tablet, Rfl: 0   traMADol (ULTRAM) 50 MG tablet, Take 1 tablet (50 mg total) by mouth every 12 (twelve) hours as needed. (Patient not taking: Reported on 04/02/2023), Disp: 20 tablet, Rfl: 0   cyclobenzaprine (FLEXERIL) 5 MG tablet, Take 1 tablet (5 mg total) by mouth 3 (three) times daily as needed for muscle spasms. (Patient not taking: Reported on 04/02/2023), Disp: 30 tablet, Rfl: 1   tranexamic acid (LYSTEDA) 650 MG TABS tablet, Take 2 tablets (1,300 mg total) by mouth 3 (three) times daily. Take during menses for a maximum of five days (Patient not taking: Reported on 04/02/2023), Disp: 30 tablet, Rfl: 2  The following portions of the patient's history were reviewed and updated as appropriate: allergies, current medications, past family history, past medical history, past social history, past surgical history and problem list.   Review of Systems:  Pertinent items noted in HPI and remainder of comprehensive ROS otherwise negative.   Objective:  Physical Exam BP 122/79   Pulse 79   Temp (!) 101 F (38.3 C)   Ht 5\' 2"  (1.575 m)   Wt 64.9 kg   LMP 03/21/2023   BMI 26.16 kg/m   CONSTITUTIONAL: Well-developed, well-nourished female in no acute distress.  HENT:  Normocephalic, atraumatic. External right and left ear normal. Oropharynx is clear and moist EYES: Conjunctivae and EOM are normal. Pupils are equal, round, and reactive to light. No scleral icterus.  NECK: Normal range of motion, supple, no masses SKIN: Skin is warm and dry. No rash noted. Not diaphoretic. No erythema. No pallor. NEUROLOGIC: Alert and oriented to person, place, and time. Normal reflexes, muscle tone coordination. No cranial nerve deficit noted. PSYCHIATRIC: Normal mood and affect. Normal behavior. Normal judgment and thought content. CARDIOVASCULAR: Normal heart rate noted RESPIRATORY: Effort normal, no problems with respiration noted ABDOMEN: Soft, no distention noted.   PELVIC: Normal appearing external genitalia; normal appearing vaginal mucosa and cervix.  Thick white fishy smelling discharge.  pelvic cultures obtained. Normal uterine size, no other palpable masses, no adnexal or uterine tenderness. MUSCULOSKELETAL: Normal range of motion. No edema noted.  Exam done with chaperone present.  Labs and Imaging No results found.  Assessment & Plan:  1. Screening examination for STI - Cervicovaginal ancillary only( [place])  2. Vaginal discharge Neg STI screen in June With symptoms, recommend repeat screen - Cervicovaginal ancillary only( [place]) - reviewed vulvar hygiene, recommendations for use of only non-scented soap, stop summer's eve wipes, see if symptoms improve  3. Bacterial vaginosis Exam suggestive of BV  4. Encounter for counseling regarding contraception  Had extended conversation regarding contraception management and IUD being optimal birth control given she does not do well with taking regular medication, reviewed that IUD could be causing symptoms but need to rule out other symptoms first   Routine preventative health maintenance measures  emphasized. Please refer to After Visit Summary for other counseling recommendations.   Return in about 2 months (around 06/02/2023) for discussion of symptoms if no improvement.   Baldemar Lenis, MD, [facility] Attending Center for [employer] [facility])

## 2023-04-04 LAB — CERVICOVAGINAL ANCILLARY ONLY
Bacterial Vaginitis (gardnerella): POSITIVE — AB
Candida Glabrata: NEGATIVE
Candida Vaginitis: POSITIVE — AB
Chlamydia: NEGATIVE
Comment: NEGATIVE
Comment: NEGATIVE
Comment: NEGATIVE
Comment: NEGATIVE
Comment: NEGATIVE
Comment: NORMAL
Neisseria Gonorrhea: NEGATIVE
Trichomonas: NEGATIVE

## 2023-04-05 MED ORDER — FLUCONAZOLE 150 MG PO TABS: 150.0000 mg | ORAL_TABLET | Freq: Once | ORAL | 0 refills | Status: AC

## 2023-04-05 NOTE — Addendum Note (Signed)
Addended by: Leroy Libman on: 04/05/2023 08:27 AM   Modules accepted: Orders

## 2023-04-19 ENCOUNTER — Ambulatory Visit (INDEPENDENT_AMBULATORY_CARE_PROVIDER_SITE_OTHER): Payer: Medicaid Other | Admitting: Obstetrics and Gynecology

## 2023-04-19 ENCOUNTER — Telehealth: Payer: Self-pay | Admitting: *Deleted

## 2023-04-19 ENCOUNTER — Encounter: Payer: Self-pay | Admitting: Obstetrics and Gynecology

## 2023-04-19 ENCOUNTER — Other Ambulatory Visit (HOSPITAL_COMMUNITY)
Admission: RE | Admit: 2023-04-19 | Discharge: 2023-04-19 | Disposition: A | Discharge status: Home / Self Care | Payer: Medicaid Other | Source: Ambulatory Visit | Attending: Obstetrics and Gynecology | Admitting: Obstetrics and Gynecology

## 2023-04-19 VITALS — BP 122/76 | HR 93 | Ht 62.0 in | Wt 143.0 lb

## 2023-04-19 DIAGNOSIS — N898 Other specified noninflammatory disorders of vagina: Secondary | ICD-10-CM

## 2023-04-19 NOTE — Progress Notes (Signed)
   RETURN GYNECOLOGY VISIT  Subjective:  Sherri Mack is a 22 y.o. G0P0000 with IUD in place presenting for follow up for vaginal odor and BV  Saw Dr. Earlene Plater 8/19 for yellowish vaginal discharge and odor. Testing came back with BV & yeast, negative for GC/CT/trich. Pt treated w/ diflucan and PO flagyl. Reports incomplete resolution of her symptoms but had difficulty tolerating the PO flagyl.   Notes that her discharge varies in appearance. Notes odor is worse after intercourse with her partner.   I personally reviewed: - Cervicovaginal ancillary on 04/02/23 negative for GC/CT/trich, BV and yeast  Objective:   Vitals:   04/19/23 1545  BP: 122/76  Pulse: 93  Weight: 143 lb (64.9 kg)  Height: 5\' 2"  (1.575 m)   General:  Alert, oriented and cooperative. Patient is in no acute distress.  Skin: Skin is warm and dry. No rash noted.   Cardiovascular: Normal heart rate noted  Respiratory: Normal respiratory effort, no problems with respiration noted  Pelvic: Deferred   Assessment and Plan:  Sherri Mack is a 22 y.o. with vaginal discharge  1. Vaginal discharge - Reviewed vulvar care with the patient, agree w/ plan to stop summer's eve and to start using unscented/mild soap on vulva only - Discussed that some patient's notice worse BV-type symptoms after intercourse so that patient can try using condoms or withdrawal method to see if that helps with discharge after intercourse - Reassurance provided - Pt will self swab today. If persistent BV, will treat with metrogel. Pt in agreement w/ plan - Cervicovaginal ancillary only( [place])  Return if symptoms worsen or fail to improve.  Lennart Pall, MD

## 2023-04-19 NOTE — Telephone Encounter (Signed)
Left patient an urgent message to call the office to reschedule appointment. Patient needs 2 month F/U, not 2 weeks.

## 2023-04-20 LAB — CERVICOVAGINAL ANCILLARY ONLY
Bacterial Vaginitis (gardnerella): POSITIVE — AB
Candida Glabrata: NEGATIVE
Candida Vaginitis: POSITIVE — AB
Comment: NEGATIVE
Comment: NEGATIVE
Comment: NEGATIVE

## 2023-04-20 MED ORDER — METRONIDAZOLE 0.75 % VA GEL: 1.0000 | Freq: Every day | VAGINAL | 1 refills | Status: DC

## 2023-04-20 MED ORDER — FLUCONAZOLE 150 MG PO TABS
150.0000 mg | ORAL_TABLET | Freq: Once | ORAL | 0 refills | Status: AC
Start: 2023-04-20 — End: 2023-04-20

## 2023-04-20 NOTE — Addendum Note (Signed)
Addended by: Harvie Bridge on: 04/20/2023 07:09 PM   Modules accepted: Orders

## 2023-05-18 ENCOUNTER — Emergency Department (HOSPITAL_BASED_OUTPATIENT_CLINIC_OR_DEPARTMENT_OTHER)
Admission: EM | Admit: 2023-05-18 | Discharge: 2023-05-18 | Disposition: A | Discharge status: Home / Self Care | Payer: Medicaid Other | Attending: Emergency Medicine | Admitting: Emergency Medicine

## 2023-05-18 ENCOUNTER — Encounter (HOSPITAL_BASED_OUTPATIENT_CLINIC_OR_DEPARTMENT_OTHER): Payer: Self-pay | Admitting: Pediatrics

## 2023-05-18 ENCOUNTER — Emergency Department (HOSPITAL_BASED_OUTPATIENT_CLINIC_OR_DEPARTMENT_OTHER): Payer: Medicaid Other

## 2023-05-18 ENCOUNTER — Other Ambulatory Visit: Payer: Self-pay

## 2023-05-18 DIAGNOSIS — R000 Tachycardia, unspecified: Secondary | ICD-10-CM | POA: Insufficient documentation

## 2023-05-18 DIAGNOSIS — R0602 Shortness of breath: Secondary | ICD-10-CM | POA: Insufficient documentation

## 2023-05-18 HISTORY — DX: Other specified behavioral and emotional disorders with onset usually occurring in childhood and adolescence: F98.8

## 2023-05-18 LAB — CBC
HCT: 42.2 % (ref 36.0–46.0)
Hemoglobin: 14.6 g/dL (ref 12.0–15.0)
MCH: 30.3 pg (ref 26.0–34.0)
MCHC: 34.6 g/dL (ref 30.0–36.0)
MCV: 87.6 fL (ref 80.0–100.0)
Platelets: 373 10*3/uL (ref 150–400)
RBC: 4.82 MIL/uL (ref 3.87–5.11)
RDW: 12.6 % (ref 11.5–15.5)
WBC: 8.9 10*3/uL (ref 4.0–10.5)
nRBC: 0 % (ref 0.0–0.2)

## 2023-05-18 LAB — TSH: TSH: 1.382 u[IU]/mL (ref 0.350–4.500)

## 2023-05-18 LAB — BASIC METABOLIC PANEL
Anion gap: 13 (ref 5–15)
BUN: 13 mg/dL (ref 6–20)
CO2: 25 mmol/L (ref 22–32)
Calcium: 9.5 mg/dL (ref 8.9–10.3)
Chloride: 101 mmol/L (ref 98–111)
Creatinine, Ser: 0.71 mg/dL (ref 0.44–1.00)
GFR, Estimated: >60 mL/min (ref >60)
Glucose, Bld: 100 mg/dL — ABNORMAL HIGH (ref 70–99)
Potassium: 3.5 mmol/L (ref 3.5–5.1)
Sodium: 139 mmol/L (ref 135–145)

## 2023-05-18 LAB — D-DIMER, QUANTITATIVE: D-Dimer, Quant: <0.27 ug{FEU}/mL (ref 0.00–0.50)

## 2023-05-18 MED ORDER — METOPROLOL SUCCINATE ER 25 MG PO TB24: 25.0000 mg | ORAL_TABLET | Freq: Every day | ORAL | 0 refills | Status: DC | PRN

## 2023-05-18 MED ORDER — METOPROLOL TARTRATE 5 MG/5ML IV SOLN
2.5000 mg | Freq: Once | INTRAVENOUS | Status: AC
  Administered 2023-05-18: 2.5 mg via INTRAVENOUS
  Filled 2023-05-18: qty 5

## 2023-05-18 NOTE — Discharge Instructions (Addendum)
Your heart rate is going fast.  It could be due to the medication.  Follow-up with your doctors for that.  You have been given a medicine that you can take if the heart rate is over 110.  Try and keep yourself hydrated.

## 2023-05-18 NOTE — ED Triage Notes (Signed)
C/O fast heart rate up to 170s. Reported she is on Focalin that was started 3 weeks ago instead of Strattera. Patient reports she missed her Focalin dose yesterday and took the usual dose today.

## 2023-05-18 NOTE — ED Provider Notes (Signed)
[facility] [facility] Provider Note   CSN: [id number] Arrival date & time: 05/18/23  1104     History  Chief Complaint  Patient presents with   Tachycardia    Sherri Mack is a 21 y.o. female.  HPI Patient with tachycardia.  Was working upstairs when began to feel her heart race and feel little short of breath.  Had heart rate up to 170 bpm.  Had EKG done that did show a tachycardia.  No swelling her legs.  Does have family history of pulmonary embolisms and lupus anticoagulant although patient herself has not been tested.  Is on Focalin.  Missed yesterday's dose but did take it today.  Has not had episodes like this in the past with the medicine.  No cough.  Does have a history of anxiety and is a little anxious about the fast heart rate.   Past Medical History:  Diagnosis Date   Anxiety    Attention deficit disorder (ADD)     Home Medications Prior to Admission medications   Medication Sig Start Date End Date Taking? Authorizing Provider  metoprolol succinate (TOPROL-XL) 25 MG 24 hr tablet Take 1 tablet (25 mg total) by mouth daily as needed (For heart rate over 110). 05/18/23  Yes Benjiman Core, MD  traMADol (ULTRAM) 50 MG tablet Take 1 tablet (50 mg total) by mouth every 12 (twelve) hours as needed. Patient not taking: Reported on 04/02/2023 07/11/22   Agapito Games, MD  cyclobenzaprine (FLEXERIL) 5 MG tablet Take 1 tablet (5 mg total) by mouth 3 (three) times daily as needed for muscle spasms. Patient not taking: Reported on 04/02/2023 07/03/22   Milas Hock, MD  levonorgestrel [facility]) 19.5 MG IUD 1 each by Intrauterine route once.    [provider]  metroNIDAZOLE (METROGEL) 0.75 % vaginal gel Place 1 Applicatorful vaginally at bedtime. Apply one applicatorful to vagina at bedtime for 5 days 04/20/23   Lennart Pall, MD  tranexamic acid (LYSTEDA) 650 MG TABS tablet Take 2 tablets (1,300 mg total) by mouth 3  (three) times daily. Take during menses for a maximum of five days Patient not taking: Reported on 04/02/2023 07/03/22   Milas Hock, MD      Allergies    Patient has no known allergies.    Review of Systems   Review of Systems  Physical Exam Updated Vital Signs BP 107/73   Pulse (!) 120   Temp 98.7 F (37.1 C) (Oral)   Resp (!) 31   Ht 5\' 2"  (1.575 m)   Wt 61.2 kg   SpO2 100%   BMI 24.69 kg/m  Physical Exam Vitals and nursing note reviewed.  HENT:     Head: Normocephalic.  Eyes:     Pupils: Pupils are equal, round, and reactive to light.  Cardiovascular:     Rate and Rhythm: Regular rhythm. Tachycardia present.  Pulmonary:     Breath sounds: No wheezing.  Abdominal:     Tenderness: There is no abdominal tenderness.  Musculoskeletal:        General: No tenderness.  Skin:    Capillary Refill: Capillary refill takes less than 2 seconds.  Neurological:     Mental Status: She is alert and oriented to person, place, and time.     ED Results / Procedures / Treatments   Labs (all labs ordered are listed, but only abnormal results are displayed) Labs Reviewed  BASIC METABOLIC PANEL - Abnormal; Notable for the following  components:      Result Value   Glucose, Bld 100 (*)    All other components within normal limits  D-DIMER, QUANTITATIVE  CBC  TSH    EKG EKG Interpretation Date/Time:  Friday May 18 2023 11:15:16 EDT Ventricular Rate:  125 PR Interval:  150 QRS Duration:  95 QT Interval:  320 QTC Calculation: 462 R Axis:   104  Text Interpretation: Sinus tachycardia Consider right atrial enlargement Borderline right axis deviation Borderline Q waves in inferior leads No old tracing to compare Confirmed by Benjiman Core [phone]) on 05/18/2023 11:34:53 AM  Radiology DG Chest 2 View  Result Date: 05/18/2023 CLINICAL DATA:  Shortness of breath. EXAM: CHEST - 2 VIEW COMPARISON:  None Available. FINDINGS: Bilateral lung fields are clear. Bilateral  costophrenic angles are clear. Normal cardio-mediastinal silhouette. No acute osseous abnormalities. The soft tissues are within normal limits. IMPRESSION: No active cardiopulmonary disease. Electronically Signed   By: Jules Schick M.D.   On: 05/18/2023 13:41    Procedures Procedures    Medications Ordered in ED Medications  metoprolol tartrate (LOPRESSOR) injection 2.5 mg (2.5 mg Intravenous Given 05/18/23 1350)    ED Course/ Medical Decision Making/ A&P                                 Medical Decision Making Amount and/or Complexity of Data Reviewed Labs: ordered. Radiology: ordered.  Risk Prescription drug management.   Tachycardia and some shortness of breath.  Is on a stimulant which could be some of the cause, however also has family history of pulmonary embolisms.  No recent travel.  EKG is an apparent sinus tachycardia.  Will get basic blood work.  Denies possibility of pregnancy.  Will get D-dimer to help rule out blood clots.  Will get chest x-ray.  Chest x-ray reassuring.  Negative D-dimer.  TSH still pending.  Has had mild tachycardia but has been sinus.  At times will rarely have down to a normal rate.  Then quickly went back up to an tachycardia.  Reviewed EKGs with Dr. Cristal Deer from cardiology.  Appears to be a sinus tachycardia.  Potential could be due to medication.  Will follow-up with PCP for medication adjustment.  Will add metoprolol to take as needed for the tachycardia.  Will discharge home.       Final Clinical Impression(s) / ED Diagnoses Final diagnoses:  Sinus tachycardia    Rx / DC Orders ED Discharge Orders          Ordered    metoprolol succinate (TOPROL-XL) 25 MG 24 hr tablet  Daily PRN        05/18/23 1430              Benjiman Core, MD 05/18/23 1437

## 2023-05-21 ENCOUNTER — Telehealth: Payer: Self-pay | Admitting: General Practice

## 2023-05-21 NOTE — Transitions of Care (Post Inpatient/ED Visit) (Signed)
   05/21/2023  Name: Sherri Mack MRN: [id number] DOB: [birth year]  Today's TOC FU Call Status: Today's TOC FU Call Status:: Unsuccessful Call (1st Attempt) Unsuccessful Call (1st Attempt) Date: 05/21/23  Attempted to reach the patient regarding the most recent Inpatient/ED visit.  Follow Up Plan: Additional outreach attempts will be made to reach the patient to complete the Transitions of Care (Post Inpatient/ED visit) call.   Signature Modesto Charon, Control and instrumentation engineer

## 2023-05-22 NOTE — Transitions of Care (Post Inpatient/ED Visit) (Signed)
   05/22/2023  Name: Sherri Mack MRN: [id number] DOB: [birth year]  Today's TOC FU Call Status: Today's TOC FU Call Status:: Unsuccessful Call (2nd Attempt) Unsuccessful Call (1st Attempt) Date: 05/21/23 Unsuccessful Call (2nd Attempt) Date: 05/22/23  Attempted to reach the patient regarding the most recent Inpatient/ED visit.  Follow Up Plan: Additional outreach attempts will be made to reach the patient to complete the Transitions of Care (Post Inpatient/ED visit) call.   Signature [place] Charon, Control and instrumentation engineer

## 2023-05-23 NOTE — Transitions of Care (Post Inpatient/ED Visit) (Signed)
   05/23/2023  Name: Sherri Mack MRN: [id number] DOB: [birth year]  Today's TOC FU Call Status: Today's TOC FU Call Status:: Unsuccessful Call (3rd Attempt) Unsuccessful Call (1st Attempt) Date: 05/21/23 Unsuccessful Call (2nd Attempt) Date: 05/22/23 Unsuccessful Call (3rd Attempt) Date: 05/23/23  Attempted to reach the patient regarding the most recent Inpatient/ED visit.  Follow Up Plan: No further outreach attempts will be made at this time. We have been unable to contact the patient.  Signature Modesto Charon, Control and instrumentation engineer

## 2023-06-15 ENCOUNTER — Telehealth: Payer: Medicaid Other | Admitting: Physician Assistant

## 2023-06-15 ENCOUNTER — Encounter: Payer: Self-pay | Admitting: Obstetrics and Gynecology

## 2023-06-15 DIAGNOSIS — N898 Other specified noninflammatory disorders of vagina: Secondary | ICD-10-CM

## 2023-06-15 DIAGNOSIS — Z3200 Encounter for pregnancy test, result unknown: Secondary | ICD-10-CM

## 2023-06-18 ENCOUNTER — Ambulatory Visit (INDEPENDENT_AMBULATORY_CARE_PROVIDER_SITE_OTHER): Payer: Medicaid Other

## 2023-06-18 ENCOUNTER — Other Ambulatory Visit (HOSPITAL_COMMUNITY)
Admission: RE | Admit: 2023-06-18 | Discharge: 2023-06-18 | Disposition: A | Discharge status: Home / Self Care | Payer: Medicaid Other | Source: Ambulatory Visit | Attending: Obstetrics and Gynecology | Admitting: Obstetrics and Gynecology

## 2023-06-18 DIAGNOSIS — Z3202 Encounter for pregnancy test, result negative: Secondary | ICD-10-CM | POA: Diagnosis not present

## 2023-06-18 DIAGNOSIS — N898 Other specified noninflammatory disorders of vagina: Secondary | ICD-10-CM | POA: Diagnosis present

## 2023-06-18 DIAGNOSIS — Z3200 Encounter for pregnancy test, result unknown: Secondary | ICD-10-CM

## 2023-06-18 LAB — POCT URINE PREGNANCY: Preg Test, Ur: NEGATIVE

## 2023-06-18 NOTE — Progress Notes (Signed)
Because there is possible pregnancy with vaginal discharge, I feel your condition warrants further evaluation and I recommend that you be seen in a face to face visit.They will be able to get all testing needed for you.    NOTE: There will be NO CHARGE for this eVisit   If you are having a true medical emergency please call 911.      For an urgent face to face visit, [place] has eight urgent care centers for your convenience:   NEW!! [facility] Health Urgent Care Center at [facility] Get Driving Directions [phone] [address] [place], [address]    [facility] Health Urgent Care Center at [facility] Get Driving Directions [phone] [address] [place], [place] [address]   [facility] Health Urgent Care Center [facility]) Get Driving Directions [phone] [address] [place], [place] [address]  [facility] Health Urgent Care Center [facility]) Get Driving Directions [phone] [address] [place],  [place]  [address]  [facility] Health Urgent Care Center [facility] - at [employer]  [phone] [address] [place],  [place] [address]   [facility] Health Urgent Care at [facility] Get Driving Directions [phone] [address] [place], [place] [address]   [facility] Health Urgent Care at [facility] Get Driving Directions  [phone] [address] [place], [place] [address]   [facility] Health Urgent Care at [facility] Directions [phone] [address] [place], [place] [address]  Your MyChart E-visit questionnaire answers were reviewed by a board certified advanced clinical practitioner to complete your personal care plan based on your specific symptoms.  Thank you for using e-Visits.    I have spent 5 minutes in review of e-visit questionnaire, review and updating patient chart, medical decision making and  response to patient.   Margaretann Loveless, PA-C

## 2023-06-18 NOTE — Progress Notes (Addendum)
Pt here for self swab for BV and yeast per Dr.Forsyth. Pt requested UPT due to nausea. UPT negative. If nausea continues pt will see PCP

## 2023-06-18 NOTE — Addendum Note (Signed)
Addended by: Kathie Dike on: 06/18/2023 04:14 PM   Modules accepted: Orders

## 2023-06-19 ENCOUNTER — Encounter: Payer: Self-pay | Admitting: Obstetrics and Gynecology

## 2023-06-19 LAB — CERVICOVAGINAL ANCILLARY ONLY
Bacterial Vaginitis (gardnerella): POSITIVE — AB
Candida Glabrata: NEGATIVE
Candida Vaginitis: POSITIVE — AB
Comment: NEGATIVE
Comment: NEGATIVE
Comment: NEGATIVE

## 2023-06-19 MED ORDER — METRONIDAZOLE 0.75 % VA GEL: 1.0000 | Freq: Every day | VAGINAL | 1 refills | Status: DC

## 2023-06-19 MED ORDER — FLUCONAZOLE 150 MG PO TABS
150.0000 mg | ORAL_TABLET | Freq: Once | ORAL | 0 refills | Status: AC
Start: 2023-06-19 — End: 2023-06-19

## 2023-06-19 NOTE — Addendum Note (Signed)
Addended by: Harvie Bridge on: 06/19/2023 05:25 PM   Modules accepted: Orders

## 2023-08-29 ENCOUNTER — Ambulatory Visit: Payer: Medicaid Other | Admitting: Obstetrics and Gynecology

## 2023-08-29 ENCOUNTER — Encounter: Payer: Self-pay | Admitting: Obstetrics and Gynecology

## 2023-08-29 VITALS — BP 119/74 | HR 89 | Ht 62.0 in | Wt 144.0 lb

## 2023-08-29 DIAGNOSIS — N760 Acute vaginitis: Secondary | ICD-10-CM

## 2023-08-29 DIAGNOSIS — Z1159 Encounter for screening for other viral diseases: Secondary | ICD-10-CM | POA: Diagnosis not present

## 2023-08-29 DIAGNOSIS — B3731 Acute candidiasis of vulva and vagina: Secondary | ICD-10-CM

## 2023-08-29 DIAGNOSIS — Z114 Encounter for screening for human immunodeficiency virus [HIV]: Secondary | ICD-10-CM

## 2023-08-29 DIAGNOSIS — B9689 Other specified bacterial agents as the cause of diseases classified elsewhere: Secondary | ICD-10-CM

## 2023-08-29 DIAGNOSIS — Z30432 Encounter for removal of intrauterine contraceptive device: Secondary | ICD-10-CM | POA: Diagnosis not present

## 2023-08-29 DIAGNOSIS — Z30430 Encounter for insertion of intrauterine contraceptive device: Secondary | ICD-10-CM

## 2023-08-29 LAB — POCT URINE PREGNANCY: Preg Test, Ur: NEGATIVE

## 2023-08-29 MED ORDER — METRONIDAZOLE 0.75 % VA GEL: 1.0000 | Freq: Every day | VAGINAL | 0 refills | Status: DC

## 2023-08-29 MED ORDER — METRONIDAZOLE 0.75 % VA GEL: 1.0000 | VAGINAL | 5 refills | Status: DC

## 2023-08-29 MED ORDER — LEVONORGESTREL 20 MCG/DAY IU IUD
1.0000 | INTRAUTERINE_SYSTEM | Freq: Once | INTRAUTERINE | Status: AC
Start: 2023-08-29 — End: 2023-08-29
  Administered 2023-08-29: 1 via INTRAUTERINE

## 2023-08-29 NOTE — Progress Notes (Signed)
   RETURN GYNECOLOGY VISIT  Subjective:  Sherri Mack is a 23 y.o. G0P0000 with Kyleena in place presenting for IUD removal and discussion of recurrent BV/yeast  Patient with recurrent BV/yeast infections x 5 months. Has tried boric acid supplements with moderate improvement, metrogel suppository x 5d, and diflucan. Uses scented detergents/products on skin and  clothes which may have irritated her vagina. Would like IUD removed and replaced today. Currently on Accutane.   I personally reviewed the following: - Cervicovaginal ancillary 04/02/23 +BV, candida - Cervicovaginal ancillary 05/09/23 +BV, candida - Cervicovaginal ancillary 06/18/23 +BV, candida  Objective:   Vitals:   08/29/23 1518  BP: 119/74  Pulse: 89  Weight: 144 lb (65.3 kg)  Height: 5\' 2"  (1.575 m)   General:  Alert, oriented and cooperative. Patient is in no acute distress.  Skin: Skin is warm and dry. No rash noted.   Cardiovascular: Normal heart rate noted  Respiratory: Normal respiratory effort, no problems with respiration noted  Abdomen: Soft, non-tender, non-distended   Pelvic: NEFG. Uterus retroverted, mobile, normal size & non tender  Exam performed in the presence of a chaperone  Assessment and Plan:  Sherri Mack is a 23 y.o. with recurrent BV/yeast and IUD In place  1. Encounter for IUD removal (Primary) 4. Encounter for IUD insertion Uncomplicated removal & insertion of mIUD. See separate procedure note - POCT urine pregnancy  2. Bacterial vaginosis Discussed episodic vs extended therapy for recurrent BV. Pt would like to try extended therapy Metrogel 0.75% 5g  twice weekly x 4-6 months    5. Encounter for screening for HIV - HIV antibody (with reflex)  6. Encounter for hepatitis C screening test for low risk patient - Hepatitis C Antibody   Return in about 3 months (around 11/27/2023) for follow up gyn - recurrent BV.  Lennart Pall, MD

## 2023-08-29 NOTE — Patient Instructions (Signed)
 It is normal to have cramping and bleeding. Most people feel a little better every day  Please call us  if you have any severe pain, bleeding that soaks more than 1 pad in a hour, have fevers, or feel like you're going to pass out.   You can take tylenol  1000mg  every 8 hours and ibuprofen 800mg  every 8 hours as needed for pain. It is ok to take both at the same time.   Please start your BV medicine NEXT week. Nothing in the vagina for 1 week to lower the risk of infection.

## 2023-08-30 NOTE — Progress Notes (Signed)
    GYNECOLOGY OFFICE PROCEDURE NOTE  Sherri Mack is a 23 y.o. G0P0000 here for IUD insertion. No GYN concerns.   IUD Insertion Procedure Note Procedure: IUD insertion with Mirena UPT: Negative GC/CT testing: Offered and declines. Negative GC/CT 04/02/23  Patient identified.  Risks, benefits and alternatives of procedure were discussed including irregular bleeding, cramping, infection, malpositioning or misplacement of the IUD outside the uterus which may require further procedure such as laparoscopy. Also discussed >99% contraception efficacy, increased risk of ectopic pregnancy with failure of method.   Emphasized that this did not protect against STIs, condoms recommended during all sexual encounters. Consent signed. Time out performed.   Speculum inserted and cervix visualized. The strings of the IUD were grasped and pulled using ring forceps. The IUD was successfully removed in its entirety.   Cervix prepped with 3 swabs of betadine. 3cc of lidocaine 1% was injected intracervically at 12 o'clock. Cervix grasped with a single tooth tenaculum. , Uterus sounded to 9 cm and IUD then inserted without difficulty per manufacturer's instructions and strings cut to 3 cm below cervical os and all instruments removed. Pt tolerated well with minimal pain and bleeding.   Discussed concerning signs/symptoms and to call if heavy bleeding, severe abdominal pain, or fever in the following 3 weeks. Manufacturer pamphlet/patient information given. Reviewed timing of efficacy for contraception and to use an alternative form of birth control until that time.  Harvie Bridge, MD Obstetrician & Gynecologist, [facility] for [employer], [facility]

## 2023-09-26 ENCOUNTER — Encounter: Payer: Self-pay | Admitting: Obstetrics and Gynecology

## 2023-09-27 ENCOUNTER — Other Ambulatory Visit: Payer: Self-pay | Admitting: *Deleted

## 2023-09-27 MED ORDER — FLUCONAZOLE 150 MG PO TABS: ORAL_TABLET | ORAL | 0 refills | Status: DC

## 2023-09-27 NOTE — Progress Notes (Signed)
Pt was seen 3 months ago for IUD removal.  She tested positive for BV and yeast but only the Metrogel was sent in.  She now is symptomatic for yeast and since the original Diflucan was never sent to her pharmacy RX now sent.

## 2023-10-18 ENCOUNTER — Telehealth: Payer: Self-pay | Admitting: *Deleted

## 2023-10-18 NOTE — Telephone Encounter (Signed)
 Left patient a message to call and schedule 3 month F/U appointment with Dr. Berton Lan.

## 2023-11-01 ENCOUNTER — Encounter: Payer: Self-pay | Admitting: Obstetrics and Gynecology

## 2023-11-12 NOTE — Progress Notes (Unsigned)
   RETURN GYNECOLOGY VISIT  Subjective:  Sherlene Rickel is a 23 y.o. G0P0000 with LMP *** presenting for ***  Recurrent BV. Tried metrogel suppression for *** weeks and did/did not see improvement ***  [ ]  discuss partner testing [ ]  consider condoms/OCP switch - confirm no estrogen contraindications  Menstrual Hx:  PMH: PSH: Meds: All: OB: Pap Hx:  Soc: FHx:  Objective:  There were no vitals filed for this visit.  General:  Alert, oriented and cooperative. Patient is in no acute distress.  Skin: Skin is warm and dry. No rash noted.   Cardiovascular: Normal heart rate noted  Respiratory: Normal respiratory effort, no problems with respiration noted  Abdomen: Soft, non-tender, non-distended   Pelvic: NEFG.   Exam performed in the presence of a chaperone  Assessment and Plan:  Evone Arseneau is a 23 y.o. with ***  There are no diagnoses linked to this encounter.  No follow-ups on file.  Future Appointments  Date Time Provider Department Center  11/14/2023 10:10 AM Lennart Pall, MD [facility]    Lennart Pall, MD

## 2023-11-14 ENCOUNTER — Other Ambulatory Visit (HOSPITAL_COMMUNITY)
Admission: RE | Admit: 2023-11-14 | Discharge: 2023-11-14 | Disposition: A | Discharge status: Home / Self Care | Source: Ambulatory Visit | Attending: Obstetrics and Gynecology | Admitting: Obstetrics and Gynecology

## 2023-11-14 ENCOUNTER — Encounter: Payer: Self-pay | Admitting: Obstetrics and Gynecology

## 2023-11-14 ENCOUNTER — Ambulatory Visit: Admitting: Obstetrics and Gynecology

## 2023-11-14 VITALS — BP 100/64 | HR 85 | Ht 62.0 in | Wt 146.0 lb

## 2023-11-14 DIAGNOSIS — N760 Acute vaginitis: Secondary | ICD-10-CM | POA: Diagnosis not present

## 2023-11-14 DIAGNOSIS — N898 Other specified noninflammatory disorders of vagina: Secondary | ICD-10-CM | POA: Diagnosis present

## 2023-11-14 DIAGNOSIS — B3731 Acute candidiasis of vulva and vagina: Secondary | ICD-10-CM | POA: Diagnosis not present

## 2023-11-14 DIAGNOSIS — B9689 Other specified bacterial agents as the cause of diseases classified elsewhere: Secondary | ICD-10-CM

## 2023-11-14 DIAGNOSIS — Z30432 Encounter for removal of intrauterine contraceptive device: Secondary | ICD-10-CM | POA: Diagnosis not present

## 2023-11-14 MED ORDER — DROSPIRENONE-ETHINYL ESTRADIOL 3-0.02 MG PO TABS: 1.0000 | ORAL_TABLET | Freq: Every day | ORAL | 11 refills | Status: DC

## 2023-11-14 NOTE — Patient Instructions (Signed)
 Partner treatment: metronidazole (oral) 500mg  twice a day for 1 week AND 2% clindamycin cream applied to the penis twice a day for 1 week Use condoms until treatment is completed

## 2023-11-15 LAB — CERVICOVAGINAL ANCILLARY ONLY
Bacterial Vaginitis (gardnerella): NEGATIVE
Candida Glabrata: NEGATIVE
Candida Vaginitis: POSITIVE — AB
Comment: NEGATIVE
Comment: NEGATIVE
Comment: NEGATIVE

## 2023-11-16 ENCOUNTER — Encounter: Payer: Self-pay | Admitting: Obstetrics and Gynecology

## 2023-11-16 MED ORDER — FLUCONAZOLE 150 MG PO TABS
150.0000 mg | ORAL_TABLET | Freq: Once | ORAL | 0 refills | Status: AC
Start: 2023-11-16 — End: 2023-11-16

## 2023-11-16 NOTE — Progress Notes (Signed)
    GYNECOLOGY OFFICE PROCEDURE NOTE  Sherri Mack is a 23 y.o. G0P0000 here for IUD removal.   IUD Removal  Patient identified, informed consent performed, consent signed.  Patient was in the dorsal lithotomy position, normal external genitalia was noted.  A speculum was placed in the patient's vagina, normal discharge was noted, no lesions. The cervix was visualized, no lesions, no abnormal discharge.  The strings of the IUD were grasped and pulled using ring forceps. The IUD was removed in its entirety.   Patient tolerated the procedure well.    Patient will use oral contraceptives for contraception.  Routine preventative health maintenance measures emphasized.  Harvie Bridge, MD Obstetrician & Gynecologist, [facility] for [employer], [facility]

## 2023-12-06 ENCOUNTER — Encounter: Payer: Self-pay | Admitting: Obstetrics and Gynecology

## 2023-12-07 MED ORDER — FLUCONAZOLE 150 MG PO TABS: 150.0000 mg | ORAL_TABLET | Freq: Once | ORAL | 0 refills | Status: AC

## 2023-12-07 NOTE — Telephone Encounter (Signed)
Please adise.

## 2024-01-14 ENCOUNTER — Other Ambulatory Visit: Payer: Self-pay

## 2024-01-14 ENCOUNTER — Ambulatory Visit: Admission: EM | Admit: 2024-01-14 | Discharge: 2024-01-14 | Disposition: A | Discharge status: Home / Self Care

## 2024-01-14 DIAGNOSIS — J029 Acute pharyngitis, unspecified: Secondary | ICD-10-CM

## 2024-01-14 DIAGNOSIS — R519 Headache, unspecified: Secondary | ICD-10-CM | POA: Diagnosis present

## 2024-01-14 DIAGNOSIS — R112 Nausea with vomiting, unspecified: Secondary | ICD-10-CM | POA: Insufficient documentation

## 2024-01-14 DIAGNOSIS — R509 Fever, unspecified: Secondary | ICD-10-CM | POA: Diagnosis not present

## 2024-01-14 DIAGNOSIS — J028 Acute pharyngitis due to other specified organisms: Secondary | ICD-10-CM | POA: Insufficient documentation

## 2024-01-14 DIAGNOSIS — B9689 Other specified bacterial agents as the cause of diseases classified elsewhere: Secondary | ICD-10-CM | POA: Diagnosis not present

## 2024-01-14 LAB — POCT MONO SCREEN (KUC): Mono, POC: NEGATIVE

## 2024-01-14 LAB — POCT RAPID STREP A (OFFICE): Rapid Strep A Screen: NEGATIVE

## 2024-01-14 LAB — POCT INFLUENZA A/B
Influenza A, POC: NEGATIVE
Influenza B, POC: NEGATIVE

## 2024-01-14 LAB — POC SARS CORONAVIRUS 2 AG -  ED: SARS Coronavirus 2 Ag: NEGATIVE

## 2024-01-14 MED ORDER — ONDANSETRON 4 MG PO TBDP: 4.0000 mg | ORAL_TABLET | Freq: Three times a day (TID) | ORAL | 0 refills | Status: DC | PRN

## 2024-01-14 MED ORDER — IBUPROFEN 800 MG PO TABS
800.0000 mg | ORAL_TABLET | Freq: Once | ORAL | Status: AC
  Administered 2024-01-14: 800 mg via ORAL

## 2024-01-14 MED ORDER — PREDNISONE 20 MG PO TABS: 40.0000 mg | ORAL_TABLET | Freq: Every day | ORAL | 0 refills | Status: AC

## 2024-01-14 MED ORDER — ONDANSETRON 4 MG PO TBDP
4.0000 mg | ORAL_TABLET | Freq: Once | ORAL | Status: AC
  Administered 2024-01-14: 4 mg via ORAL

## 2024-01-14 MED ORDER — AMOXICILLIN-POT CLAVULANATE 875-125 MG PO TABS: 1.0000 | ORAL_TABLET | Freq: Two times a day (BID) | ORAL | 0 refills | Status: AC

## 2024-01-14 NOTE — ED Provider Notes (Signed)
 [facility]    CSN: [id number] Arrival date & time: 01/14/24  1305      History   Chief Complaint Chief Complaint  Patient presents with   Sore Throat    HPI Sherri Mack is a 23 y.o. female.   Sherri Mack is a 23 y.o. female presenting for chief complaint of Sore Throat, nasal congestion, generalized fatigue, headache, chills, and nausea/vomiting that started 5 days ago on Wednesday Jan 09, 2024. Her boyfriend was sick with similar symptoms for 1 day and is now better. She notes white patches to the tonsils and tonsillar swelling. Headache is generalized, sore throat is worsened by swallowing. She has had a few episodes of vomiting over the last 24 hours, no blood in output. She reports chills without known max temp at home. Denies cough, rash, dizziness, abdominal pain, diarrhea, visual disturbance, ear pain, shortness of breath, and chest pain. Taking dayquil and nyquil with minimal relief. Currently febrile at 103.0 without any recent antipyretics.  LMP 12/31/23, takes daily OCPs, denies chance of pregnancy.    Sore Throat    Past Medical History:  Diagnosis Date   Anxiety    Attention deficit disorder (ADD)     Patient Active Problem List   Diagnosis Date Noted   Cysts of both ovaries 06/28/2022   Lower abdominal pain 06/28/2022   Pancreatic cyst 06/28/2022   Inattention 12/27/2021   Impulsiveness 12/27/2021   Outbursts of anger 12/26/2021   Poor concentration 12/26/2021   Anxiety 06/14/2021   Bipolar 1 disorder (HCC) 06/14/2021    Past Surgical History:  Procedure Laterality Date   NO PAST SURGERIES      OB History     Gravida  0   Para  0   Term  0   Preterm  0   AB  0   Living  0      SAB  0   IAB  0   Ectopic  0   Multiple  0   Live Births  0            Home Medications    Prior to Admission medications   Medication Sig Start Date End Date Taking? Authorizing Provider  amoxicillin -clavulanate (AUGMENTIN )  875-125 MG tablet Take 1 tablet by mouth every 12 (twelve) hours for 10 days. 01/14/24 01/24/24 Yes Tauheedah Bok, Danny Dye, FNP  ISOtretinoin (ACCUTANE) 20 MG capsule Take 20 mg by mouth 2 (two) times daily.   Yes [provider]  ondansetron  (ZOFRAN -ODT) 4 MG disintegrating tablet Take 1 tablet (4 mg total) by mouth every 8 (eight) hours as needed for nausea or vomiting. 01/14/24  Yes Starlene Eaton, FNP  predniSONE (DELTASONE) 20 MG tablet Take 2 tablets (40 mg total) by mouth daily with breakfast for 5 days. 01/14/24 01/19/24 Yes StanhopeDanny Dye, FNP  drospirenone -ethinyl estradiol  (YAZ) 3-0.02 MG tablet Take 1 tablet by mouth daily. 11/14/23   Izell Marsh, MD    Family History Family History  Problem Relation Age of Onset   Ovarian cancer Maternal Grandmother    Ovarian cancer Maternal Aunt     Social History Social History   Tobacco Use   Smoking status: Never   Smokeless tobacco: Never  Vaping Use   Vaping status: Never Used  Substance Use Topics   Alcohol use: Yes    Comment: rarely   Drug use: Yes    Types: Marijuana     Allergies   Patient has no known allergies.  Review of Systems Review of Systems Per HPI  Physical Exam Triage Vital Signs ED Triage Vitals  Encounter Vitals Group     BP 01/14/24 1309 108/68     Systolic BP Percentile --      Diastolic BP Percentile --      Pulse Rate 01/14/24 1309 80     Resp 01/14/24 1309 16     Temp 01/14/24 1309 99.8 F (37.7 C)     Temp src --      SpO2 01/14/24 1309 98 %     Weight --      Height --      Head Circumference --      Peak Flow --      Pain Score 01/14/24 1312 9     Pain Loc --      Pain Education --      Exclude from Growth Chart --    No data found.  Updated Vital Signs BP 108/68   Pulse 80   Temp (!) 103 F (39.4 C)   Resp 16   LMP 12/31/2023 (Approximate)   SpO2 98%   Visual Acuity Right Eye Distance:   Left Eye Distance:   Bilateral Distance:    Right Eye  Near:   Left Eye Near:    Bilateral Near:     Physical Exam Vitals and nursing note reviewed.  Constitutional:      Appearance: She is ill-appearing. She is not toxic-appearing.  HENT:     Head: Normocephalic and atraumatic.     Right Ear: Hearing, tympanic membrane, ear canal and external ear normal.     Left Ear: Hearing, tympanic membrane, ear canal and external ear normal.     Nose: Congestion present.     Mouth/Throat:     Lips: Pink.     Mouth: Mucous membranes are moist. No injury or oral lesions.     Dentition: Normal dentition.     Tongue: No lesions.     Pharynx: Uvula midline. Pharyngeal swelling and posterior oropharyngeal erythema present. No oropharyngeal exudate, uvula swelling or postnasal drip.     Tonsils: Tonsillar exudate present. 3+ on the right. 3+ on the left.     Comments: No trismus, phonation normal, maintaining secretions without difficulty.  Eyes:     General: Lids are normal. Vision grossly intact. Gaze aligned appropriately.     Extraocular Movements: Extraocular movements intact.     Conjunctiva/sclera: Conjunctivae normal.  Neck:     Trachea: Trachea and phonation normal.  Cardiovascular:     Rate and Rhythm: Regular rhythm. Tachycardia present.     Heart sounds: Normal heart sounds, S1 normal and S2 normal.  Pulmonary:     Effort: Pulmonary effort is normal. No respiratory distress.     Breath sounds: Normal breath sounds and air entry. No wheezing, rhonchi or rales.  Chest:     Chest wall: No tenderness.  Musculoskeletal:     Cervical back: Neck supple.  Lymphadenopathy:     Cervical: Cervical adenopathy present.  Skin:    General: Skin is warm and dry.     Capillary Refill: Capillary refill takes less than 2 seconds.     Findings: No rash.  Neurological:     General: No focal deficit present.     Mental Status: She is alert and oriented to person, place, and time. Mental status is at baseline.     Cranial Nerves: No dysarthria or facial  asymmetry.  Psychiatric:  Mood and Affect: Mood normal.        Speech: Speech normal.        Behavior: Behavior normal.        Thought Content: Thought content normal.        Judgment: Judgment normal.      UC Treatments / Results  Labs (all labs ordered are listed, but only abnormal results are displayed) Labs Reviewed  POCT RAPID STREP A (OFFICE) - Normal  CULTURE, GROUP A STREP [facility])  POCT INFLUENZA A/B  POCT MONO SCREEN ([facility])  POC SARS CORONAVIRUS 2 AG -  ED    EKG   Radiology No results found.  Procedures Procedures (including critical care time)  Medications Ordered in UC Medications  ondansetron  (ZOFRAN -ODT) disintegrating tablet 4 mg (4 mg Oral Given 01/14/24 1351)  ibuprofen (ADVIL) tablet 800 mg (800 mg Oral Given 01/14/24 1351)    Initial Impression / Assessment and Plan / UC Course  I have reviewed the triage vital signs and the nursing notes.  Pertinent labs & imaging results that were available during my care of the patient were reviewed by me and considered in my medical decision making (see chart for details).   1.  Acute bacterial pharyngitis, nausea and vomiting, fever Group A strep testing is negative, throat culture is pending. Mono testing ordered after negative strep and is negative as well.   POC COVID and flu are additionally negative.  High clinical suspicion for bacterial pharyngitis given history and exam findings. No red flags on exam, she is maintaining secretions without difficulty.  Augmentin  twice daily for 10 days ordered. Prednisone burst ordered to treat swelling to the tonsils, she may start this tomorrow since we gave her ibuprofen in the clinic for throat pain and fever. Fever reduced to 102.2 on recheck after ibuprofen. She may use Zofran  every 8 hours as needed for nausea and vomiting.  Counseled patient on potential for adverse effects with medications prescribed/recommended today, strict ER and return-to-clinic  precautions discussed, patient verbalized understanding.    Final Clinical Impressions(s) / UC Diagnoses   Final diagnoses:  Acute bacterial pharyngitis  Nausea and vomiting, unspecified vomiting type  Fever, unspecified     Discharge Instructions      Your strep test was negative, however I'm concerned you may have bacteria to the throat based on your physical exam findings and your symptoms. Mono, flu, and COVID are negative.  Take antibiotic for the next 10 days as prescribed. Take prednisone 40mg  once daily for the next 5 days.  Do not take any ibuprofen when taking the prednisone.   I have sent the strep test for culture (looks for bacteria under a microscope) and will call you if the results change the treatment plan.  Take tylenol  as needed for fever. Salt water gargles as needed.  Change your toothbrush after 2-3 days of antibiotics to prevent re-infection.  If you develop any new or worsening symptoms or if your symptoms do not start to improve, please return here or follow-up with your primary care provider. If your symptoms are severe, please go to the emergency room.    ED Prescriptions     Medication Sig Dispense Auth. Provider   amoxicillin -clavulanate (AUGMENTIN ) 875-125 MG tablet Take 1 tablet by mouth every 12 (twelve) hours for 10 days. 20 tablet Decklyn Hyder M, FNP   predniSONE (DELTASONE) 20 MG tablet Take 2 tablets (40 mg total) by mouth daily with breakfast for 5 days. 10 tablet Shella Devoid M,  FNP   ondansetron  (ZOFRAN -ODT) 4 MG disintegrating tablet Take 1 tablet (4 mg total) by mouth every 8 (eight) hours as needed for nausea or vomiting. 20 tablet Starlene Eaton, FNP      PDMP not reviewed this encounter.   Starlene Eaton, [place] 01/14/24 1424

## 2024-01-14 NOTE — Discharge Instructions (Addendum)
 Your strep test was negative, however I'm concerned you may have bacteria to the throat based on your physical exam findings and your symptoms. Mono, flu, and COVID are negative.  Take antibiotic for the next 10 days as prescribed. Take prednisone 40mg  once daily for the next 5 days.  Do not take any ibuprofen when taking the prednisone.   I have sent the strep test for culture (looks for bacteria under a microscope) and will call you if the results change the treatment plan.  Take tylenol  as needed for fever. Salt water gargles as needed.  Change your toothbrush after 2-3 days of antibiotics to prevent re-infection.  If you develop any new or worsening symptoms or if your symptoms do not start to improve, please return here or follow-up with your primary care provider. If your symptoms are severe, please go to the emergency room.

## 2024-01-14 NOTE — ED Triage Notes (Signed)
 Has been sick for 4-5 days. Has c/o ha, eyes hurt, enlarged tonsils with white exudate. Has vomited and feels fatigued. Has been taking dayquil, nyquil.

## 2024-01-15 ENCOUNTER — Emergency Department (HOSPITAL_BASED_OUTPATIENT_CLINIC_OR_DEPARTMENT_OTHER)
Admission: EM | Admit: 2024-01-15 | Discharge: 2024-01-15 | Disposition: A | Discharge status: Home / Self Care | Attending: Emergency Medicine | Admitting: Emergency Medicine

## 2024-01-15 ENCOUNTER — Emergency Department (HOSPITAL_BASED_OUTPATIENT_CLINIC_OR_DEPARTMENT_OTHER)

## 2024-01-15 ENCOUNTER — Encounter (HOSPITAL_BASED_OUTPATIENT_CLINIC_OR_DEPARTMENT_OTHER): Payer: Self-pay | Admitting: Urology

## 2024-01-15 DIAGNOSIS — J039 Acute tonsillitis, unspecified: Secondary | ICD-10-CM | POA: Diagnosis not present

## 2024-01-15 DIAGNOSIS — J392 Other diseases of pharynx: Secondary | ICD-10-CM | POA: Insufficient documentation

## 2024-01-15 DIAGNOSIS — R509 Fever, unspecified: Secondary | ICD-10-CM | POA: Diagnosis present

## 2024-01-15 DIAGNOSIS — J029 Acute pharyngitis, unspecified: Secondary | ICD-10-CM

## 2024-01-15 LAB — CBC WITH DIFFERENTIAL/PLATELET
Abs Immature Granulocytes: 0.05 10*3/uL (ref 0.00–0.07)
Basophils Absolute: 0 10*3/uL (ref 0.0–0.1)
Basophils Relative: 0 %
Eosinophils Absolute: 0 10*3/uL (ref 0.0–0.5)
Eosinophils Relative: 0 %
HCT: 36.4 % (ref 36.0–46.0)
Hemoglobin: 12.9 g/dL (ref 12.0–15.0)
Immature Granulocytes: 1 %
Lymphocytes Relative: 12 %
Lymphs Abs: 1.2 10*3/uL (ref 0.7–4.0)
MCH: 30.1 pg (ref 26.0–34.0)
MCHC: 35.4 g/dL (ref 30.0–36.0)
MCV: 84.8 fL (ref 80.0–100.0)
Monocytes Absolute: 0.8 10*3/uL (ref 0.1–1.0)
Monocytes Relative: 8 %
Neutro Abs: 7.9 10*3/uL — ABNORMAL HIGH (ref 1.7–7.7)
Neutrophils Relative %: 79 %
Platelets: 255 10*3/uL (ref 150–400)
RBC: 4.29 MIL/uL (ref 3.87–5.11)
RDW: 12.3 % (ref 11.5–15.5)
WBC: 9.8 10*3/uL (ref 4.0–10.5)
nRBC: 0 % (ref 0.0–0.2)

## 2024-01-15 LAB — COMPREHENSIVE METABOLIC PANEL WITH GFR
ALT: 14 U/L (ref 0–44)
AST: 23 U/L (ref 15–41)
Albumin: 4 g/dL (ref 3.5–5.0)
Alkaline Phosphatase: 48 U/L (ref 38–126)
Anion gap: 14 (ref 5–15)
BUN: 5 mg/dL — ABNORMAL LOW (ref 6–20)
CO2: 20 mmol/L — ABNORMAL LOW (ref 22–32)
Calcium: 8.9 mg/dL (ref 8.9–10.3)
Chloride: 99 mmol/L (ref 98–111)
Creatinine, Ser: 0.74 mg/dL (ref 0.44–1.00)
GFR, Estimated: >60 mL/min (ref >60)
Glucose, Bld: 103 mg/dL — ABNORMAL HIGH (ref 70–99)
Potassium: 3.6 mmol/L (ref 3.5–5.1)
Sodium: 134 mmol/L — ABNORMAL LOW (ref 135–145)
Total Bilirubin: 0.2 mg/dL (ref 0.0–1.2)
Total Protein: 7.3 g/dL (ref 6.5–8.1)

## 2024-01-15 MED ORDER — FENTANYL CITRATE PF 50 MCG/ML IJ SOSY
25.0000 ug | PREFILLED_SYRINGE | Freq: Once | INTRAMUSCULAR | Status: AC
  Administered 2024-01-15: 25 ug via INTRAVENOUS
  Filled 2024-01-15: qty 1

## 2024-01-15 MED ORDER — DEXAMETHASONE SODIUM PHOSPHATE 10 MG/ML IJ SOLN
10.0000 mg | Freq: Once | INTRAMUSCULAR | Status: AC
  Administered 2024-01-15: 10 mg via INTRAVENOUS
  Filled 2024-01-15: qty 1

## 2024-01-15 MED ORDER — SODIUM CHLORIDE 0.9 % IV SOLN
1.0000 g | Freq: Once | INTRAVENOUS | Status: AC
  Administered 2024-01-15: 1 g via INTRAVENOUS
  Filled 2024-01-15: qty 10

## 2024-01-15 MED ORDER — IOHEXOL 300 MG/ML  SOLN
100.0000 mL | Freq: Once | INTRAMUSCULAR | Status: AC | PRN
  Administered 2024-01-15: 75 mL via INTRAVENOUS

## 2024-01-15 MED ORDER — LACTATED RINGERS IV BOLUS
2000.0000 mL | Freq: Once | INTRAVENOUS | Status: AC
  Administered 2024-01-15: 999 mL via INTRAVENOUS

## 2024-01-15 MED ORDER — ONDANSETRON HCL 4 MG/2ML IJ SOLN
4.0000 mg | Freq: Once | INTRAMUSCULAR | Status: AC
  Administered 2024-01-15: 4 mg via INTRAVENOUS
  Filled 2024-01-15: qty 2

## 2024-01-15 NOTE — ED Triage Notes (Signed)
 Pt states feeling bad since last Thursday  Was seen at [facility] yesterday  Was tested for FLU, COVID, STREP, and MONO all negative  Had fever of 104  Rx for antibiotic and prednisone given yesterday but vomited after taking them  States breathing trouble at night and sore throat getting worse  Tylenol  last at 0700

## 2024-01-15 NOTE — Discharge Instructions (Signed)
 Continue taking Augmentin .  You were given a dose of IV antibiotic in the emergency room.  Follow-up with your primary care provider.  Return for any emergent symptoms.  Take 1000 mg of Tylenol  every 6 hours for fever and pain control along with 600 mg of ibuprofen every 6 hours.  Drink plenty of fluids.

## 2024-01-15 NOTE — ED Provider Notes (Signed)
 [place] [facility] Provider Note   CSN: [id number] Arrival date & time: 01/15/24  1216     History  Chief Complaint  Patient presents with   Sore Throat    Sherri Mack is a 23 y.o. female.  23 year old female presents with her mom for concern of persistent symptoms including pharyngitis, not been able to eat or drink anything, fever, as well as headache.  Was recently evaluated at urgent care with a reassuring workup.  States she was given steroids and Augmentin  but has not been able to keep these down.  The history is provided by the patient. No language interpreter was used.       Home Medications Prior to Admission medications   Medication Sig Start Date End Date Taking? Authorizing Provider  amoxicillin -clavulanate (AUGMENTIN ) 875-125 MG tablet Take 1 tablet by mouth every 12 (twelve) hours for 10 days. 01/14/24 01/24/24  Sherri Eaton, FNP  drospirenone -ethinyl estradiol  (YAZ) 3-0.02 MG tablet Take 1 tablet by mouth daily. 11/14/23   Sherri Marsh, MD  ISOtretinoin (ACCUTANE) 20 MG capsule Take 20 mg by mouth 2 (two) times daily.    [provider]  ondansetron  (ZOFRAN -ODT) 4 MG disintegrating tablet Take 1 tablet (4 mg total) by mouth every 8 (eight) hours as needed for nausea or vomiting. 01/14/24   Sherri Eaton, FNP  predniSONE (DELTASONE) 20 MG tablet Take 2 tablets (40 mg total) by mouth daily with breakfast for 5 days. 01/14/24 01/19/24  Sherri Eaton, FNP      Allergies    Patient has no known allergies.    Review of Systems   Review of Systems  Constitutional:  Negative for chills and fever.  HENT:  Positive for sore throat and voice change. Negative for drooling and trouble swallowing.   Respiratory:  Positive for cough. Negative for shortness of breath.   Gastrointestinal:  Positive for vomiting.  All other systems reviewed and are negative.   Physical Exam Updated Vital Signs BP 92/61  (BP Location: Right Arm)   Pulse (!) 104   Temp 98.5 F (36.9 C) (Oral)   Resp 18   Ht 5\' 2"  (1.575 m)   Wt 66.2 kg   LMP 12/31/2023 (Approximate)   SpO2 100%   BMI 26.69 kg/m  Physical Exam Vitals and nursing note reviewed.  Constitutional:      General: She is not in acute distress.    Appearance: Normal appearance. She is not ill-appearing.  HENT:     Head: Normocephalic and atraumatic.     Nose: Nose normal.     Mouth/Throat:     Mouth: Mucous membranes are moist.     Pharynx: Oropharyngeal exudate and posterior oropharyngeal erythema present.  Eyes:     Conjunctiva/sclera: Conjunctivae normal.  Cardiovascular:     Rate and Rhythm: Normal rate and regular rhythm.  Pulmonary:     Effort: Pulmonary effort is normal. No respiratory distress.  Musculoskeletal:        General: No deformity. Normal range of motion.     Cervical back: Normal range of motion.  Skin:    Findings: No rash.  Neurological:     Mental Status: She is alert.     ED Results / Procedures / Treatments   Labs (all labs ordered are listed, but only abnormal results are displayed) Labs Reviewed  CBC WITH DIFFERENTIAL/PLATELET - Abnormal; Notable for the following components:      Result Value   Neutro  Abs 7.9 (*)    All other components within normal limits  COMPREHENSIVE METABOLIC PANEL WITH GFR - Abnormal; Notable for the following components:   Sodium 134 (*)    CO2 20 (*)    Glucose, Bld 103 (*)    BUN 5 (*)    All other components within normal limits    EKG None  Radiology No results found.  Procedures Procedures    Medications Ordered in ED Medications  lactated ringers bolus 2,000 mL (999 mLs Intravenous New Bag/Given 01/15/24 1503)  dexamethasone (DECADRON) injection 10 mg (10 mg Intravenous Given 01/15/24 1436)  fentaNYL (SUBLIMAZE) injection 25 mcg (25 mcg Intravenous Given 01/15/24 1504)  ondansetron  (ZOFRAN ) injection 4 mg (4 mg Intravenous Given 01/15/24 1434)  iohexol   (OMNIPAQUE ) 300 MG/ML solution 100 mL (75 mLs Intravenous Contrast Given 01/15/24 1443)    ED Course/ Medical Decision Making/ A&P                                 Medical Decision Making Amount and/or Complexity of Data Reviewed Labs: ordered. Radiology: ordered.  Risk Prescription drug management.   Medical Decision Making / ED Course   This patient presents to the ED for concern of pharyngitis, this involves an extensive number of treatment options, and is a complaint that carries with it a high risk of complications and morbidity.  The differential diagnosis includes peritonsillar abscess, Ludwig's angina, retropharyngeal abscess, tonsillitis, viral URI  MDM: 23 year old female presents with her mom for concern of persistent symptoms including voice change, pharyngitis, difficulty swallowing.  Will obtain CT soft tissue neck, CT head given significant headache and ongoing symptoms.  CBC without leukocytosis or anemia.  CMP without acute concerns.  CT soft tissue neck with evidence of tonsillitis and pharyngitis.  CT head without acute intracranial process.  After fluids and medicine patient is feeling improved.  Dose of Rocephin, Decadron given in the ED.  Discussed follow-up. Discharged in stable condition.  Patient voices understanding and is in agreement with plan. She will continue taking Augmentin .  Additional history obtained: -Additional history obtained from urgent care note, mom at bedside -External records from outside source obtained and reviewed including: Chart review including previous notes, labs, imaging, consultation notes   Lab Tests: -I ordered, reviewed, and interpreted labs.   The pertinent results include:   Labs Reviewed  CBC WITH DIFFERENTIAL/PLATELET - Abnormal; Notable for the following components:      Result Value   Neutro Abs 7.9 (*)    All other components within normal limits  COMPREHENSIVE METABOLIC PANEL WITH GFR - Abnormal; Notable  for the following components:   Sodium 134 (*)    CO2 20 (*)    Glucose, Bld 103 (*)    BUN 5 (*)    All other components within normal limits      EKG  EKG Interpretation Date/Time:    Ventricular Rate:    PR Interval:    QRS Duration:    QT Interval:    QTC Calculation:   R Axis:      Text Interpretation:           Imaging Studies ordered: I ordered imaging studies including CT soft tissue neck, CT head I independently visualized and interpreted imaging. I agree with the radiologist interpretation   Medicines ordered and prescription drug management: Meds ordered this encounter  Medications   lactated ringers bolus 2,000 mL  dexamethasone (DECADRON) injection 10 mg   fentaNYL (SUBLIMAZE) injection 25 mcg   ondansetron  (ZOFRAN ) injection 4 mg   iohexol  (OMNIPAQUE ) 300 MG/ML solution 100 mL   cefTRIAXone (ROCEPHIN) 1 g in sodium chloride  0.9 % 100 mL IVPB    Antibiotic Indication::   Other Indication (list below)    Other Indication::   tonsillitis   fentaNYL (SUBLIMAZE) injection 25 mcg    -I have reviewed the patients home medicines and have made adjustments as needed  Reevaluation: After the interventions noted above, I reevaluated the patient and found that they have :improved  Co morbidities that complicate the patient evaluation  Past Medical History:  Diagnosis Date   Anxiety    Attention deficit disorder (ADD)       Dispostion: Discharged in stable condition.  Return precaution discussed.  Patient voices understanding and is in agreement with the plan.   Final Clinical Impression(s) / ED Diagnoses Final diagnoses:  Pharyngitis, unspecified etiology  Tonsillitis    Rx / DC Orders ED Discharge Orders     None         Lucina Sabal, PA-C 01/15/24 1754    Teddi Favors, DO 01/17/24 0740

## 2024-01-15 NOTE — ED Notes (Signed)
Pt ambulated to restroom independently without difficulty.

## 2024-01-16 LAB — CULTURE, GROUP A STREP (THRC)

## 2024-03-04 ENCOUNTER — Encounter: Payer: Self-pay | Admitting: Physician Assistant

## 2024-03-04 ENCOUNTER — Ambulatory Visit (INDEPENDENT_AMBULATORY_CARE_PROVIDER_SITE_OTHER): Admitting: Physician Assistant

## 2024-03-04 VITALS — BP 110/60 | HR 66 | Temp 98.4°F | Ht 62.0 in | Wt 142.0 lb

## 2024-03-04 DIAGNOSIS — M542 Cervicalgia: Secondary | ICD-10-CM | POA: Diagnosis not present

## 2024-03-04 DIAGNOSIS — J029 Acute pharyngitis, unspecified: Secondary | ICD-10-CM | POA: Diagnosis not present

## 2024-03-04 MED ORDER — METHYLPREDNISOLONE SODIUM SUCC 125 MG IJ SOLR
125.0000 mg | Freq: Once | INTRAMUSCULAR | Status: AC
  Administered 2024-03-04: 125 mg via INTRAMUSCULAR

## 2024-03-04 NOTE — Patient Instructions (Signed)
 Vitals reassuring CBC yesterday looked great Suspected post viral inflammation and pain Discussed symptomatic care Solumedrol 125mg  given in office today

## 2024-03-05 ENCOUNTER — Encounter: Payer: Self-pay | Admitting: Physician Assistant

## 2024-03-05 DIAGNOSIS — J029 Acute pharyngitis, unspecified: Secondary | ICD-10-CM | POA: Insufficient documentation

## 2024-03-05 DIAGNOSIS — M542 Cervicalgia: Secondary | ICD-10-CM | POA: Insufficient documentation

## 2024-03-05 NOTE — Progress Notes (Signed)
 Established Patient Office Visit  Subjective   Patient ID: Sherri Mack, female    DOB: 2000/11/04  Age: 23 y.o. MRN: [id number]  Chief Complaint  Patient presents with   Sore Throat    Swollowing discomfort     HPI Pt is a 23 yo female who presents to the clinic to discuss persistent ST for the last 6 weeks. She was first seen by UC on 6/2 and given oral augmentin  and prednisone . Her strep,flu, mono,covid were all negative. She did not respond well to prednisone  and had to stop it and go to ED on 6/3. She was given decadron  orally. She had a CT of neck soft tissues that was normal. CBC no acute abnormality. Rocephin  given IM and told to finish oral augmentin . She is much better but still has sore throat. 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 She had a CBC yesterday where she works and WBC was normal. She is just concerned that her bilateral neck discomfort is something more serious. No recent fever, chills. She is able to eat and drink.      Objective:     BP 110/60   Pulse 66   Temp 98.4 F (36.9 C) (Oral)   Ht 5' 2 (1.575 m)   Wt 142 lb (64.4 kg)   SpO2 100%   BMI 25.97 kg/m  BP Readings from Last 3 Encounters:  03/04/24 110/60  01/15/24 103/68  01/14/24 108/68   Wt Readings from Last 3 Encounters:  03/04/24 142 lb (64.4 kg)  01/15/24 145 lb 15.1 oz (66.2 kg)  11/14/23 146 lb (66.2 kg)      Physical Exam Constitutional:      Appearance: She is well-developed.  HENT:     Head: Normocephalic.     Right Ear: Tympanic membrane and ear canal normal. No drainage, swelling or tenderness. No middle ear effusion. Tympanic membrane is not erythematous.     Left Ear: Tympanic membrane and ear canal normal. No drainage, swelling or tenderness.  No middle ear effusion. Tympanic membrane is not erythematous.     Nose: No congestion or rhinorrhea.     Mouth/Throat:     Mouth: Mucous membranes are moist. No  oral lesions.     Pharynx: Uvula midline. No pharyngeal swelling, oropharyngeal exudate, posterior oropharyngeal erythema or uvula swelling.     Tonsils: No tonsillar exudate or tonsillar abscesses. 0 on the right. 0 on the left.     Comments: Anterior cervical shotty tender adenopathy, bilateral.  Eyes:     Conjunctiva/sclera: Conjunctivae normal.  Cardiovascular:     Rate and Rhythm: Normal rate and regular rhythm.     Heart sounds: Normal heart sounds. No murmur heard. Pulmonary:     Effort: Pulmonary effort is normal.     Breath sounds: Normal breath sounds.  Neurological:     General: No focal deficit present.     Mental Status: She is alert and oriented to person, place, and time.  Psychiatric:        Mood and Affect: Mood normal.     Assessment & Plan:  Sherri Mack was seen today for sore throat.  Diagnoses and all orders for this visit:  Sore throat -     methylPREDNISolone  sodium succinate (SOLU-MEDROL ) 125 mg/2 mL injection 125 mg  Anterior neck pain -     methylPREDNISolone  sodium succinate (SOLU-MEDROL ) 125 mg/2 mL injection 125 mg   No red  flags or abnormal physical exam findings today Her anterior cervical lymph nodes were present but to be expected for a few weeks following an infection She has finished her augmentin  She did not tolerate or finish her prednisone  Solumedrol 125mg  given IM today to help with any residual inflammation.  Reassured her about normal CT of neck.  Follow up as needed if symptoms persist or change or worsen.     Sherri Salameh, PA-C

## 2024-03-21 ENCOUNTER — Encounter: Payer: Self-pay | Admitting: Physician Assistant

## 2024-03-21 ENCOUNTER — Ambulatory Visit (INDEPENDENT_AMBULATORY_CARE_PROVIDER_SITE_OTHER): Admitting: Physician Assistant

## 2024-03-21 DIAGNOSIS — R000 Tachycardia, unspecified: Secondary | ICD-10-CM

## 2024-03-21 DIAGNOSIS — F4325 Adjustment disorder with mixed disturbance of emotions and conduct: Secondary | ICD-10-CM | POA: Insufficient documentation

## 2024-03-21 DIAGNOSIS — F430 Acute stress reaction: Secondary | ICD-10-CM | POA: Diagnosis not present

## 2024-03-21 MED ORDER — CLONAZEPAM 0.5 MG PO TABS: 0.5000 mg | ORAL_TABLET | Freq: Two times a day (BID) | ORAL | 0 refills | Status: DC | PRN

## 2024-03-21 MED ORDER — PROPRANOLOL HCL 20 MG PO TABS: 20.0000 mg | ORAL_TABLET | Freq: Three times a day (TID) | ORAL | 0 refills | Status: AC
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
## 2024-03-21 NOTE — Patient Instructions (Signed)
 Take propranol when at work and during the day. Take klonapin other times as needed for acute anxiety.  Will make referral to counselor.

## 2024-03-24 ENCOUNTER — Encounter: Payer: Self-pay | Admitting: Physician Assistant

## 2024-03-24 DIAGNOSIS — R000 Tachycardia, unspecified: Secondary | ICD-10-CM | POA: Insufficient documentation

## 2024-03-26 ENCOUNTER — Ambulatory Visit: Admitting: Physician Assistant

## 2024-04-15 ENCOUNTER — Ambulatory Visit: Admitting: Physician Assistant

## 2024-04-22 ENCOUNTER — Ambulatory Visit: Admitting: Physician Assistant

## 2024-04-22 DIAGNOSIS — F4325 Adjustment disorder with mixed disturbance of emotions and conduct: Secondary | ICD-10-CM

## 2024-04-22 DIAGNOSIS — R000 Tachycardia, unspecified: Secondary | ICD-10-CM

## 2024-04-22 DIAGNOSIS — F430 Acute stress reaction: Secondary | ICD-10-CM

## 2024-04-28 ENCOUNTER — Ambulatory Visit
Admission: EM | Admit: 2024-04-28 | Discharge: 2024-04-28 | Disposition: A | Discharge status: Home / Self Care | Attending: Family Medicine | Admitting: Family Medicine

## 2024-04-28 DIAGNOSIS — M65931 Unspecified synovitis and tenosynovitis, right forearm: Secondary | ICD-10-CM

## 2024-04-28 MED ORDER — PREDNISONE 10 MG (21) PO TBPK
ORAL_TABLET | Freq: Every day | ORAL | 0 refills | Status: DC
Start: 2024-04-28 — End: 2024-05-13
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
## 2024-04-28 NOTE — ED Triage Notes (Signed)
 Pt c/o RT hand pain since Fri. Worsening in last couple days. Denies injury. Pain radiates up wrist and arm. Ltd ROM. Ibuprofen  prn with no relief.

## 2024-04-28 NOTE — Discharge Instructions (Addendum)
 Advised patient to take medication as directed with food to completion.  Encouraged increase daily water intake to 64 ounces per day while taking this medication.  Advised if symptoms worsen and/or unresolved please follow-up with your PCP or [facility] orthopedics for further evaluation.

## 2024-05-13 ENCOUNTER — Ambulatory Visit: Admitting: Physician Assistant

## 2024-05-13 VITALS — BP 98/58 | HR 99 | Wt 145.0 lb

## 2024-05-13 DIAGNOSIS — R41840 Attention and concentration deficit: Secondary | ICD-10-CM

## 2024-05-13 DIAGNOSIS — F430 Acute stress reaction: Secondary | ICD-10-CM

## 2024-05-13 DIAGNOSIS — F411 Generalized anxiety disorder: Secondary | ICD-10-CM | POA: Insufficient documentation

## 2024-05-13 DIAGNOSIS — F5102 Adjustment insomnia: Secondary | ICD-10-CM

## 2024-05-13 DIAGNOSIS — F4325 Adjustment disorder with mixed disturbance of emotions and conduct: Secondary | ICD-10-CM

## 2024-05-13 DIAGNOSIS — R632 Polyphagia: Secondary | ICD-10-CM | POA: Insufficient documentation

## 2024-05-13 MED ORDER — LISDEXAMFETAMINE DIMESYLATE 10 MG PO CAPS: 10.0000 mg | ORAL_CAPSULE | Freq: Every day | ORAL | 0 refills | Status: DC

## 2024-05-13 MED ORDER — SERTRALINE HCL 25 MG PO TABS: 25.0000 mg | ORAL_TABLET | Freq: Every day | ORAL | 1 refills | Status: DC

## 2024-05-13 MED ORDER — HYDROXYZINE PAMOATE 25 MG PO CAPS: ORAL_CAPSULE | ORAL | 1 refills | Status: DC

## 2024-05-13 NOTE — Patient Instructions (Addendum)
 Start zoloft  and vyvanse Hydroxyzine for sleep  Generalized Anxiety Disorder, Adult Generalized anxiety disorder (GAD) is a mental health condition. Unlike normal worries, anxiety related to GAD is not triggered by a specific event. These worries do not fade or get better with time. GAD interferes with relationships, work, and school. GAD symptoms can vary from mild to severe. People with severe GAD can have intense waves of anxiety with physical symptoms that are similar to panic attacks. What are the causes? The exact cause of GAD is not known, but the following are believed to have an impact: Differences in natural brain chemicals. Genes passed down from parents to children. Differences in the way threats are perceived. Development and stress during childhood. Personality. What increases the risk? The following factors may make you more likely to develop this condition: Being female. Having a family history of anxiety disorders. Being very shy. Experiencing very stressful life events, such as the death of a loved one. Having a very stressful family environment. What are the signs or symptoms? People with GAD often worry excessively about many things in their lives, such as their health and family. Symptoms may also include: Mental and emotional symptoms: Worrying excessively about natural disasters. Fear of being late. Difficulty concentrating. Fears that others are judging your performance. Physical symptoms: Fatigue. Headaches, muscle tension, muscle twitches, trembling, or feeling shaky. Feeling like your heart is pounding or beating very fast. Feeling out of breath or like you cannot take a deep breath. Having trouble falling asleep or staying asleep, or experiencing restlessness. Sweating. Nausea, diarrhea, or irritable bowel syndrome (IBS). Behavioral symptoms: Experiencing erratic moods or irritability. Avoidance of new situations. Avoidance of people. Extreme  difficulty making decisions. How is this diagnosed? This condition is diagnosed based on your symptoms and medical history. You will also have a physical exam. Your health care provider may perform tests to rule out other possible causes of your symptoms. To be diagnosed with GAD, a person must have anxiety that: Is out of his or her control. Affects several different aspects of his or her life, such as work and relationships. Causes distress that makes him or her unable to take part in normal activities. Includes at least three symptoms of GAD, such as restlessness, fatigue, trouble concentrating, irritability, muscle tension, or sleep problems. Before your health care provider can confirm a diagnosis of GAD, these symptoms must be present more days than they are not, and they must last for 6 months or longer. How is this treated? This condition may be treated with: Medicine. Antidepressant medicine is usually prescribed for long-term daily control. Anti-anxiety medicines may be added in severe cases, especially when panic attacks occur. Talk therapy (psychotherapy). Certain types of talk therapy can be helpful in treating GAD by providing support, education, and guidance. Options include: Cognitive behavioral therapy (CBT). People learn coping skills and self-calming techniques to ease their physical symptoms. They learn to identify unrealistic thoughts and behaviors and to replace them with more appropriate thoughts and behaviors. Acceptance and commitment therapy (ACT). This treatment teaches people how to be mindful as a way to cope with unwanted thoughts and feelings. Biofeedback. This process trains you to manage your body's response (physiological response) through breathing techniques and relaxation methods. You will work with a therapist while machines are used to monitor your physical symptoms. Stress management techniques. These include yoga, meditation, and exercise. A mental health  specialist can help determine which treatment is best for you. Some people see improvement with  one type of therapy. However, other people require a combination of therapies. Follow these instructions at home: Lifestyle Maintain a consistent routine and schedule. Anticipate stressful situations. Create a plan and allow extra time to work with your plan. Practice stress management or self-calming techniques that you have learned from your therapist or your health care provider. Exercise regularly and spend time outdoors. Eat a healthy diet that includes plenty of vegetables, fruits, whole grains, low-fat dairy products, and lean protein. Do not eat a lot of foods that are high in fat, added sugar, or salt (sodium). Drink plenty of water. Avoid alcohol. Alcohol can increase anxiety. Avoid caffeine and certain over-the-counter cold medicines. These may make you feel worse. Ask your pharmacist which medicines to avoid. General instructions Take over-the-counter and prescription medicines only as told by your health care provider. Understand that you are likely to have setbacks. Accept this and be kind to yourself as you persist to take better care of yourself. Anticipate stressful situations. Create a plan and allow extra time to work with your plan. Recognize and accept your accomplishments, even if you judge them as small. Spend time with people who care about you. Keep all follow-up visits. This is important. Where to find more information [employer] of Mental Health: http://www.maynard.net/ Substance Abuse and Mental Health Services: [employer].pt Contact a health care provider if: Your symptoms do not get better. Your symptoms get worse. You have signs of depression, such as: A persistently sad or irritable mood. Loss of enjoyment in activities that used to bring you joy. Change in weight or eating. Changes in sleeping habits. Get help right away if: You have thoughts about hurting  yourself or others. If you ever feel like you may hurt yourself or others, or have thoughts about taking your own life, get help right away. Go to your nearest emergency department or: Call your local emergency services (911 in the [place] Call a suicide crisis helpline, such as the National Suicide Prevention Lifeline at 219-567-9711 or 988 in the [place] This is open 24 hours a day in the [place] If you're a Veteran: Call 988 and press 1. This is open 24 hours a day. Text the [employer] at (718) 451-0626. Summary Generalized anxiety disorder (GAD) is a mental health condition that involves worry that is not triggered by a specific event. People with GAD often worry excessively about many things in their lives, such as their health and family. GAD may cause symptoms such as restlessness, trouble concentrating, sleep problems, frequent sweating, nausea, diarrhea, headaches, and trembling or muscle twitching. A mental health specialist can help determine which treatment is best for you. Some people see improvement with one type of therapy. However, other people require a combination of therapies. This information is not intended to replace advice given to you by your health care provider. Make sure you discuss any questions you have with your health care provider. Document Revised: 03/15/2023 Document Reviewed: 11/21/2020 Elsevier Patient Education  2024 [employer].

## 2024-05-13 NOTE — Progress Notes (Signed)
   Established Patient Office Visit  Subjective   Patient ID: Sherri Mack, female    DOB: 05/15/2001  Age: 23 y.o. MRN: [id number]  No chief complaint on file.   HPI Discussed the use of AI scribe software for clinical note transcription with the patient, who gave verbal consent to proceed.  History of Present Illness     ROS    Objective:     LMP 04/21/2024 (Approximate)  {Vitals History (Optional):23777}  Physical Exam   No results found for any visits on 05/13/24.  {Labs (Optional):23779}  The ASCVD Risk score (Arnett DK, et al., 2019) failed to calculate for the following reasons:   The 2019 ASCVD risk score is only valid for ages 15 to 72    Assessment & Plan:  Assessment and Plan Assessment & Plan    No follow-ups on file.    Eugenio Dollins, PA-C

## 2024-05-16 ENCOUNTER — Encounter: Payer: Self-pay | Admitting: Physician Assistant

## 2024-05-23 ENCOUNTER — Other Ambulatory Visit: Payer: Self-pay | Admitting: Medical Genetics

## 2024-05-27 ENCOUNTER — Other Ambulatory Visit

## 2024-06-07 ENCOUNTER — Other Ambulatory Visit: Payer: Self-pay | Attending: Medical Genetics

## 2024-06-10 ENCOUNTER — Ambulatory Visit (INDEPENDENT_AMBULATORY_CARE_PROVIDER_SITE_OTHER): Admitting: Physician Assistant

## 2024-06-10 VITALS — BP 105/79 | HR 93 | Ht 62.0 in | Wt 144.0 lb

## 2024-06-10 DIAGNOSIS — R632 Polyphagia: Secondary | ICD-10-CM | POA: Diagnosis not present

## 2024-06-10 DIAGNOSIS — R1032 Left lower quadrant pain: Secondary | ICD-10-CM | POA: Insufficient documentation

## 2024-06-10 DIAGNOSIS — F4325 Adjustment disorder with mixed disturbance of emotions and conduct: Secondary | ICD-10-CM

## 2024-06-10 DIAGNOSIS — F411 Generalized anxiety disorder: Secondary | ICD-10-CM

## 2024-06-10 DIAGNOSIS — H60392 Other infective otitis externa, left ear: Secondary | ICD-10-CM

## 2024-06-10 DIAGNOSIS — F430 Acute stress reaction: Secondary | ICD-10-CM | POA: Diagnosis not present

## 2024-06-10 DIAGNOSIS — R41840 Attention and concentration deficit: Secondary | ICD-10-CM

## 2024-06-10 DIAGNOSIS — S00412A Abrasion of left ear, initial encounter: Secondary | ICD-10-CM

## 2024-06-10 DIAGNOSIS — Z79899 Other long term (current) drug therapy: Secondary | ICD-10-CM

## 2024-06-10 MED ORDER — LISDEXAMFETAMINE DIMESYLATE 20 MG PO CAPS: 20.0000 mg | ORAL_CAPSULE | ORAL | 0 refills | Status: DC

## 2024-06-10 MED ORDER — LISDEXAMFETAMINE DIMESYLATE 20 MG PO CAPS
20.0000 mg | ORAL_CAPSULE | ORAL | 0 refills | Status: AC
Start: 2024-08-09 — End: ?

## 2024-06-10 MED ORDER — SERTRALINE HCL 50 MG PO TABS: 50.0000 mg | ORAL_TABLET | Freq: Every day | ORAL | 0 refills | Status: DC

## 2024-06-10 NOTE — Patient Instructions (Signed)
 Get labs.  Increased vyvanse and zoloft  Miralax as needed for potential constipation
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
## 2024-06-11 ENCOUNTER — Encounter: Payer: Self-pay | Admitting: Physician Assistant

## 2024-06-11 ENCOUNTER — Ambulatory Visit: Payer: Self-pay | Admitting: Physician Assistant

## 2024-06-11 DIAGNOSIS — H60392 Other infective otitis externa, left ear: Secondary | ICD-10-CM | POA: Insufficient documentation

## 2024-06-11 LAB — CBC WITH DIFFERENTIAL/PLATELET
Basophils Absolute: 0 x10E3/uL (ref 0.0–0.2)
Basos: 0 %
EOS (ABSOLUTE): 0.2 x10E3/uL (ref 0.0–0.4)
Eos: 2 %
Hematocrit: 37.5 % (ref 34.0–46.6)
Hemoglobin: 12.5 g/dL (ref 11.1–15.9)
Immature Grans (Abs): 0 x10E3/uL (ref 0.0–0.1)
Immature Granulocytes: 0 %
Lymphocytes Absolute: 2.9 x10E3/uL (ref 0.7–3.1)
Lymphs: 38 %
MCH: 30.4 pg (ref 26.6–33.0)
MCHC: 33.3 g/dL (ref 31.5–35.7)
MCV: 91 fL (ref 79–97)
Monocytes Absolute: 0.5 x10E3/uL (ref 0.1–0.9)
Monocytes: 7 %
Neutrophils Absolute: 4.1 x10E3/uL (ref 1.4–7.0)
Neutrophils: 53 %
Platelets: 299 x10E3/uL (ref 150–450)
RBC: 4.11 x10E6/uL (ref 3.77–5.28)
RDW: 12.8 % (ref 11.7–15.4)
WBC: 7.6 x10E3/uL (ref 3.4–10.8)

## 2024-06-11 LAB — CMP14+EGFR
ALT: 10 IU/L (ref 0–32)
AST: 17 IU/L (ref 0–40)
Albumin: 4.5 g/dL (ref 4.0–5.0)
Alkaline Phosphatase: 51 IU/L (ref 41–116)
BUN/Creatinine Ratio: 17 (ref 9–23)
BUN: 10 mg/dL (ref 6–20)
Bilirubin Total: 0.2 mg/dL (ref 0.0–1.2)
CO2: 20 mmol/L (ref 20–29)
Calcium: 9.5 mg/dL (ref 8.7–10.2)
Chloride: 102 mmol/L (ref 96–106)
Creatinine, Ser: 0.59 mg/dL (ref 0.57–1.00)
Globulin, Total: 2.1 g/dL (ref 1.5–4.5)
Glucose: 86 mg/dL (ref 70–99)
Potassium: 4 mmol/L (ref 3.5–5.2)
Sodium: 138 mmol/L (ref 134–144)
Total Protein: 6.6 g/dL (ref 6.0–8.5)
eGFR: 131 mL/min/1.73 (ref >59)

## 2024-06-11 LAB — LIPASE: Lipase: 24 U/L (ref 14–72)

## 2024-06-11 NOTE — Progress Notes (Signed)
 Labs look GREAT.

## 2024-06-14 ENCOUNTER — Other Ambulatory Visit
Admission: RE | Admit: 2024-06-14 | Discharge: 2024-06-14 | Disposition: A | Payer: Self-pay | Source: Ambulatory Visit | Attending: Medical Genetics | Admitting: Medical Genetics

## 2024-06-17 ENCOUNTER — Encounter: Payer: Self-pay | Admitting: Physician Assistant

## 2024-06-23 LAB — GENECONNECT MOLECULAR SCREEN: Genetic Analysis Overall Interpretation: NEGATIVE

## 2024-07-02 NOTE — Progress Notes (Unsigned)
   ANNUAL EXAM Patient name: Sherri Mack MRN [id number]  Date of birth: 14-Aug-2001 Chief Complaint:   No chief complaint on file.  History of Present Illness:   Sherri Mack is a 23 y.o. G0P0000 female being seen today for a routine annual exam.   Current concerns: ***  Current birth control: Yaz  No LMP recorded.   Gardasil: Completed Last Pap/Pap History:  None  Review of Systems:   Pertinent items are noted in HPI Denies any headaches, blurred vision, fatigue, shortness of breath, chest pain, abdominal pain, abnormal vaginal discharge/itching/odor/irritation, problems with periods, bowel movements, urination, or intercourse unless otherwise stated above. *** Pertinent History Reviewed:  Reviewed past medical,surgical, social and family history.  Reviewed problem list, medications and allergies. Physical Assessment:  There were no vitals filed for this visit.There is no height or weight on file to calculate BMI.   Physical Examination:  General appearance - well appearing, and in no distress Mental status - alert, oriented to person, place, and time Psych:  She has a normal mood and affect Skin - warm and dry, normal color, no suspicious lesions noted Chest - effort normal Heart - normal rate  Breasts - breasts appear normal, no suspicious masses, no skin or nipple changes or axillary nodes Abdomen - soft, nontender, nondistended, no masses or organomegaly Pelvic -  {Blank single:19197::Performed and:,declines,not indicated} VULVA: {Blank single:19197::Not examined,normal appearing vulva with no masses, tenderness or lesions,***} VAGINA: {Blank single:19197::Not examined,normal appearing vagina with normal color and discharge, no lesions,***} CERVIX: {Blank single:19197::Not examined,normal appearing cervix without discharge or lesions, no CMT.,***} UTERUS: {Blank single:19197::Not examined,uterus is felt to be normal size, shape, consistency  and nontender,***} ADNEXA: {Blank single:19197::Not examined,No adnexal masses or tenderness noted,***} Extremities:  No swelling or varicosities noted  Chaperone present for exam  No results found for this or any previous visit (from the past 24 hours).  Assessment & Plan:  Diagnoses and all orders for this visit:  Encounter for annual routine gynecological examination  - Cervical cancer screening: Discussed screening Q3 years. Reviewed importance of annual exams and limits of pap smear. Pap with reflex HPV done - GC/CT: Discussed and recommended. Pt  {Blank single:19197::accepts,declines} - Gardasil: completed - Birth Control: OCPs - Breast Health: Encouraged self breast awareness/exams. Teaching provided. - Follow-up: 12 months and prn     No orders of the defined types were placed in this encounter.   Meds: No orders of the defined types were placed in this encounter.   Follow-up: No follow-ups on file.  Vina Solian, MD 07/02/2024 12:52 PM

## 2024-07-03 ENCOUNTER — Encounter: Payer: Self-pay | Admitting: Obstetrics and Gynecology

## 2024-07-03 ENCOUNTER — Other Ambulatory Visit (HOSPITAL_COMMUNITY)
Admission: RE | Admit: 2024-07-03 | Discharge: 2024-07-03 | Disposition: A | Discharge status: Home / Self Care | Source: Ambulatory Visit | Attending: Obstetrics and Gynecology | Admitting: Obstetrics and Gynecology

## 2024-07-03 ENCOUNTER — Ambulatory Visit (INDEPENDENT_AMBULATORY_CARE_PROVIDER_SITE_OTHER): Admitting: Obstetrics and Gynecology

## 2024-07-03 VITALS — BP 119/76 | HR 106 | Ht 61.0 in | Wt 137.0 lb

## 2024-07-03 DIAGNOSIS — Z113 Encounter for screening for infections with a predominantly sexual mode of transmission: Secondary | ICD-10-CM | POA: Diagnosis present

## 2024-07-03 DIAGNOSIS — Z01419 Encounter for gynecological examination (general) (routine) without abnormal findings: Secondary | ICD-10-CM

## 2024-07-03 MED ORDER — DROSPIRENONE-ETHINYL ESTRADIOL 3-0.02 MG PO TABS: 1.0000 | ORAL_TABLET | Freq: Every day | ORAL | 3 refills | Status: AC

## 2024-07-04 LAB — HEPATITIS C ANTIBODY: Hep C Virus Ab: NONREACTIVE

## 2024-07-04 LAB — SYPHILIS: RPR W/REFLEX TO RPR TITER AND TREPONEMAL ANTIBODIES, TRADITIONAL SCREENING AND DIAGNOSIS ALGORITHM: RPR Ser Ql: NONREACTIVE

## 2024-07-04 LAB — HIV ANTIBODY (ROUTINE TESTING W REFLEX): HIV Screen 4th Generation wRfx: NONREACTIVE

## 2024-07-08 LAB — CYTOLOGY - PAP
Chlamydia: NEGATIVE
Comment: NEGATIVE
Comment: NORMAL
Diagnosis: NEGATIVE
Neisseria Gonorrhea: NEGATIVE

## 2024-07-15 ENCOUNTER — Ambulatory Visit: Payer: Self-pay | Admitting: Obstetrics and Gynecology

## 2024-08-29 ENCOUNTER — Encounter: Admitting: Physician Assistant

## 2024-09-03 ENCOUNTER — Encounter: Payer: Self-pay | Admitting: Physician Assistant

## 2024-09-03 NOTE — Telephone Encounter (Signed)
 Ok to make virutal

## 2024-09-09 ENCOUNTER — Telehealth: Admitting: Physician Assistant

## 2024-09-09 ENCOUNTER — Ambulatory Visit: Admitting: Physician Assistant

## 2024-09-09 DIAGNOSIS — R632 Polyphagia: Secondary | ICD-10-CM

## 2024-09-09 DIAGNOSIS — Z832 Family history of diseases of the blood and blood-forming organs and certain disorders involving the immune mechanism: Secondary | ICD-10-CM | POA: Diagnosis not present

## 2024-09-09 DIAGNOSIS — R41840 Attention and concentration deficit: Secondary | ICD-10-CM | POA: Diagnosis not present

## 2024-09-09 DIAGNOSIS — K862 Cyst of pancreas: Secondary | ICD-10-CM | POA: Diagnosis not present

## 2024-09-09 DIAGNOSIS — F4325 Adjustment disorder with mixed disturbance of emotions and conduct: Secondary | ICD-10-CM | POA: Diagnosis not present

## 2024-09-09 DIAGNOSIS — F430 Acute stress reaction: Secondary | ICD-10-CM

## 2024-09-09 DIAGNOSIS — F411 Generalized anxiety disorder: Secondary | ICD-10-CM

## 2024-09-09 MED ORDER — LISDEXAMFETAMINE DIMESYLATE 30 MG PO CAPS: 30.0000 mg | ORAL_CAPSULE | Freq: Every day | ORAL | 0 refills | Status: AC

## 2024-09-09 MED ORDER — SERTRALINE HCL 50 MG PO TABS: 50.0000 mg | ORAL_TABLET | Freq: Every day | ORAL | 1 refills | Status: AC

## 2024-09-09 NOTE — Progress Notes (Signed)
 .Virtual Visit via Video Note  I connected with Sherri Mack on 09/09/24 at 10:10 AM EST by a video enabled telemedicine application and verified that I am speaking with the correct person using two identifiers.  Location: Patient: home Provider: clinic  .Participating in visit:  Patient: Sherri Mack Provider: [place] Provider in training: Annitta Raw PA-S   I discussed the limitations of evaluation and management by telemedicine and the availability of in person appointments. The patient expressed understanding and agreed to proceed.  History of Present Illness: Patient is a 24 yo female who calls into the clinic for refills for ADD and GAD.   Mood is doing well with no concerns. She is taking zoloft  daily.   She is taking vyvanse  in the mornings but does feel like effects of focus wear off while she is at school in the afternoons. No problems or concerns with medication.   She is concerned about blood clots. Her mom has antiphosolipid syndrome and had numerous blood clots. Patient just started OCP in November and has never been tested. She would like to be tested. She gets cramps intermittently in her calves and always makes her wonder.   She has hx of pancreatic cyst and wants to make sure she is following up. Has not had MRI since 2023.      Observations/Objective: No acute distress Normal mood and appearance Normal breathing      09/09/2024   10:00 AM 07/03/2024    4:10 PM 06/10/2024    2:22 PM 05/16/2024    7:49 AM 05/13/2024   11:31 AM  Depression screen PHQ 2/9  Decreased Interest 0 0 0 1 0  Down, Depressed, Hopeless 0 0 0 0 0  PHQ - 2 Score 0 0 0 1 0  Altered sleeping 1 1 1 1 3   Tired, decreased energy 0 0 0 1 2  Change in appetite 1 0 0 0 3  Feeling bad or failure about yourself  0 0 0  0  Trouble concentrating 0 0 0 0 0  Moving slowly or fidgety/restless 0 0 0 0 0  Suicidal thoughts 0 0 0 0 0  PHQ-9 Score 2 1 1  3  8    Difficult doing work/chores Not difficult  at all  Not difficult at all Somewhat difficult Somewhat difficult     Data saved with a previous flowsheet row definition   .    09/09/2024   10:02 AM 07/03/2024    4:10 PM 06/10/2024    2:22 PM 05/16/2024    7:49 AM  GAD 7 : Generalized Anxiety Score  Nervous, Anxious, on Edge 0 0  1  1   Control/stop worrying 1 0  1  1   Worry too much - different things 1 1  2  1    Trouble relaxing 0 2  2  1    Restless 0 0  1  1   Easily annoyed or irritable 0 1  1  1    Afraid - awful might happen 1 1  2  1    Total GAD 7 Score 3 5 10 7   Anxiety Difficulty Not difficult at all  Not difficult at all Somewhat difficult     Data saved with a previous flowsheet row definition       Assessment and Plan: SABRASABRADiagnoses and all orders for this visit:  GAD (generalized anxiety disorder) -     sertraline  (ZOLOFT ) 50 MG tablet; Take 1 tablet (50 mg total) by mouth daily.  Adjustment disorder with mixed disturbance of emotions and conduct -     sertraline  (ZOLOFT ) 50 MG tablet; Take 1 tablet (50 mg total) by mouth daily.  Binge eating -     lisdexamfetamine  (VYVANSE ) 30 MG capsule; Take 1 capsule (30 mg total) by mouth daily. -     lisdexamfetamine  (VYVANSE ) 30 MG capsule; Take 1 capsule (30 mg total) by mouth daily. -     lisdexamfetamine  (VYVANSE ) 30 MG capsule; Take 1 capsule (30 mg total) by mouth daily.  Attention or concentration deficit -     lisdexamfetamine  (VYVANSE ) 30 MG capsule; Take 1 capsule (30 mg total) by mouth daily. -     lisdexamfetamine  (VYVANSE ) 30 MG capsule; Take 1 capsule (30 mg total) by mouth daily. -     lisdexamfetamine  (VYVANSE ) 30 MG capsule; Take 1 capsule (30 mg total) by mouth daily.  Stress reaction -     sertraline  (ZOLOFT ) 50 MG tablet; Take 1 tablet (50 mg total) by mouth daily.  Pancreatic cyst -     MR Abdomen W Wo Contrast; Future  Family history of antiphospholipid syndrome   PHQ/GAD to goal - refilled zoloft   ADD/Binge Eating - increased  Vyvanse  to 30mg  daily - tolerating medication well Follow up in 3 months  Pancreatic cyst in 2023 with suggested follow up annually - MRI ordered for follow up  Family history of antiphospholipid syndrome - hypercoagulable panel ordered    Follow Up Instructions:    I discussed the assessment and treatment plan with the patient. The patient was provided an opportunity to ask questions and all were answered. The patient agreed with the plan and demonstrated an understanding of the instructions.   The patient was advised to call back or seek an in-person evaluation if the symptoms worsen or if the condition fails to improve as anticipated.    Jeda Pardue, PA-C

## 2024-09-12 ENCOUNTER — Encounter: Payer: Self-pay | Admitting: Physician Assistant

## 2024-09-12 DIAGNOSIS — Z832 Family history of diseases of the blood and blood-forming organs and certain disorders involving the immune mechanism: Secondary | ICD-10-CM

## 2024-09-15 ENCOUNTER — Other Ambulatory Visit
